# Patient Record
Sex: Female | Born: 1980
Health system: Southern US, Community
[De-identification: ages and names within clinical notes are randomized; demographics above are authoritative.]

## PROBLEM LIST (undated history)

## (undated) DIAGNOSIS — J45909 Unspecified asthma, uncomplicated: Secondary | ICD-10-CM

## (undated) DIAGNOSIS — J40 Bronchitis, not specified as acute or chronic: Secondary | ICD-10-CM

## (undated) HISTORY — DX: Bronchitis, not specified as acute or chronic: J40

## (undated) HISTORY — DX: Unspecified asthma, uncomplicated: J45.909

## (undated) HISTORY — PX: BRONCHOSCOPY: SUR163

---

## 2019-12-13 ENCOUNTER — Encounter (HOSPITAL_COMMUNITY): Payer: Self-pay

## 2019-12-13 ENCOUNTER — Other Ambulatory Visit: Payer: Self-pay

## 2019-12-13 ENCOUNTER — Ambulatory Visit (INDEPENDENT_AMBULATORY_CARE_PROVIDER_SITE_OTHER): Payer: No Typology Code available for payment source

## 2019-12-13 ENCOUNTER — Ambulatory Visit (HOSPITAL_COMMUNITY)
Admission: EM | Admit: 2019-12-13 | Discharge: 2019-12-13 | Disposition: A | Discharge status: Home / Self Care | Payer: No Typology Code available for payment source | Attending: Family Medicine | Admitting: Family Medicine

## 2019-12-13 DIAGNOSIS — Z79899 Other long term (current) drug therapy: Secondary | ICD-10-CM | POA: Insufficient documentation

## 2019-12-13 DIAGNOSIS — Z20822 Contact with and (suspected) exposure to covid-19: Secondary | ICD-10-CM | POA: Insufficient documentation

## 2019-12-13 DIAGNOSIS — R062 Wheezing: Secondary | ICD-10-CM | POA: Diagnosis not present

## 2019-12-13 DIAGNOSIS — R042 Hemoptysis: Secondary | ICD-10-CM | POA: Insufficient documentation

## 2019-12-13 DIAGNOSIS — R0989 Other specified symptoms and signs involving the circulatory and respiratory systems: Secondary | ICD-10-CM | POA: Diagnosis not present

## 2019-12-13 DIAGNOSIS — R059 Cough, unspecified: Secondary | ICD-10-CM

## 2019-12-13 DIAGNOSIS — R0789 Other chest pain: Secondary | ICD-10-CM | POA: Diagnosis not present

## 2019-12-13 DIAGNOSIS — R05 Cough: Secondary | ICD-10-CM

## 2019-12-13 DIAGNOSIS — R079 Chest pain, unspecified: Secondary | ICD-10-CM | POA: Insufficient documentation

## 2019-12-13 DIAGNOSIS — R0602 Shortness of breath: Secondary | ICD-10-CM | POA: Insufficient documentation

## 2019-12-13 MED ORDER — GUAIFENESIN ER 600 MG PO TB12: 600.0000 mg | ORAL_TABLET | Freq: Two times a day (BID) | ORAL | 0 refills | Status: DC

## 2019-12-13 MED ORDER — PREDNISONE 10 MG PO TABS: 40.0000 mg | ORAL_TABLET | Freq: Every day | ORAL | 0 refills | Status: AC

## 2019-12-13 MED ORDER — AZITHROMYCIN 250 MG PO TABS: 250.0000 mg | ORAL_TABLET | Freq: Every day | ORAL | 0 refills | Status: DC

## 2019-12-13 NOTE — ED Provider Notes (Signed)
[facility]    CSN: [id number] Arrival date & time: 12/13/19  1113      History   Chief Complaint Chief Complaint  Patient presents with  . Cough    HPI Erica Khan is a 39 y.o. female.   Otherwise healthy 39 year old female presents today with approximate 1 week or more of productive cough,increased sputum production,  mild shortness of breath.  Symptoms have been constant, waxing and waning. Somewhat improved over the past 2 days. Feels better when she clears the sputum out of her chest.  She has been coughing up blood-tinged sputum and sputum varies in color.  Recent travel back to the [place] from [place].  She has not been taking anything for her symptoms.  Denies any fever, chills, body aches, nasal congestion, rhinorrhea, sore throat or ear pain.  Denies any history of DVT or PE.  ROS per HPI      History reviewed. No pertinent past medical history.  There are no problems to display for this patient.   History reviewed. No pertinent surgical history.  OB History   No obstetric history on file.      Home Medications    Prior to Admission medications   Medication Sig Start Date End Date Taking? Authorizing Provider  azithromycin (ZITHROMAX) 250 MG tablet Take 1 tablet (250 mg total) by mouth daily. Take first 2 tablets together, then 1 every day until finished. 12/13/19   Jatinder Mcdonagh A, NP  guaiFENesin (MUCINEX) 600 MG 12 hr tablet Take 1 tablet (600 mg total) by mouth 2 (two) times daily. 12/13/19   Dahlia Byes A, NP  predniSONE (DELTASONE) 10 MG tablet Take 4 tablets (40 mg total) by mouth daily for 5 days. 12/13/19 12/18/19  Janace Aris, NP    Family History Family History  Problem Relation Age of Onset  . Healthy Mother   . Healthy Father     Social History Social History   Tobacco Use  . Smoking status: Never Smoker  . Smokeless tobacco: Never Used  Substance Use Topics  . Alcohol use: Yes    Comment: occ  . Drug use: Not on file      Allergies   Patient has no known allergies.   Review of Systems Review of Systems   Physical Exam Triage Vital Signs ED Triage Vitals  Enc Vitals Group     BP 12/13/19 1232 101/72     Pulse Rate 12/13/19 1232 77     Resp 12/13/19 1232 19     Temp 12/13/19 1232 98.8 F (37.1 C)     Temp Source 12/13/19 1232 Oral     SpO2 12/13/19 1232 99 %     Weight --      Height --      Head Circumference --      Peak Flow --      Pain Score 12/13/19 1230 0     Pain Loc --      Pain Edu? --      Excl. in GC? --    No data found.  Updated Vital Signs BP 101/72 (BP Location: Right Arm)   Pulse 77   Temp 98.8 F (37.1 C) (Oral)   Resp 19   LMP 11/20/2019 (Exact Date)   SpO2 99%   Visual Acuity Right Eye Distance:   Left Eye Distance:   Bilateral Distance:    Right Eye Near:   Left Eye Near:    Bilateral Near:  Physical Exam Vitals and nursing note reviewed.  Constitutional:      General: She is not in acute distress.    Appearance: Normal appearance. She is not ill-appearing, toxic-appearing or diaphoretic.  HENT:     Head: Normocephalic.     Nose: Nose normal.     Mouth/Throat:     Pharynx: Oropharynx is clear.  Eyes:     Conjunctiva/sclera: Conjunctivae normal.  Cardiovascular:     Rate and Rhythm: Normal rate and regular rhythm.     Pulses: Normal pulses.     Heart sounds: Normal heart sounds.  Pulmonary:     Effort: Pulmonary effort is normal.     Breath sounds: Wheezing and rhonchi present.  Musculoskeletal:        General: Normal range of motion.     Cervical back: Normal range of motion.  Skin:    General: Skin is warm and dry.     Findings: No rash.  Neurological:     Mental Status: She is alert.  Psychiatric:        Mood and Affect: Mood normal.      UC Treatments / Results  Labs (all labs ordered are listed, but only abnormal results are displayed) Labs Reviewed  NOVEL CORONAVIRUS, NAA (HOSP ORDER, SEND-OUT TO REF LAB; TAT 18-24  HRS)    EKG   Radiology No results found.  Procedures Procedures (including critical care time)  Medications Ordered in UC Medications - No data to display  Initial Impression / Assessment and Plan / UC Course  I have reviewed the triage vital signs and the nursing notes.  Pertinent labs & imaging results that were available during my care of the patient were reviewed by me and considered in my medical decision making (see chart for details).     Cough- pt with one week of cough,increased sputum and mild SOB.  X ray revealed Significant right-sided bullous changes with associated pleural effusion. This likely is mostly chronic in nature although given the effusion and acute infiltrative component cannot be totally excluded. CT of the chest with contrast may be helpful for further Evaluation. Spoke with pt about results  Consulted with infectious disease and didn't believe TB or anything infectious to be the cause.  Most likely chronic problem Recommended follow up with primary care for CT scan and follow up with pulmonology.  Pt understanding ans agreed Will treat for acute infectious process to see if this improves symptoms.    Final Clinical Impressions(s) / UC Diagnoses   Final diagnoses:  None     Discharge Instructions     Take the medication as prescribed Please follow up for CT scan I am giving you contact for primary care doctor.  Call them today.  If your breathing gets worse go to the ER    ED Prescriptions    Medication Sig Dispense Auth. Provider   predniSONE (DELTASONE) 10 MG tablet Take 4 tablets (40 mg total) by mouth daily for 5 days. 20 tablet Felcia Huebert A, NP   azithromycin (ZITHROMAX) 250 MG tablet Take 1 tablet (250 mg total) by mouth daily. Take first 2 tablets together, then 1 every day until finished. 6 tablet Adalina Dopson A, NP   guaiFENesin (MUCINEX) 600 MG 12 hr tablet Take 1 tablet (600 mg total) by mouth 2 (two) times daily. 15  tablet Keion Neels A, NP     PDMP not reviewed this encounter.   Orvan July, NP 12/16/19 1555

## 2019-12-13 NOTE — Discharge Instructions (Signed)
Take the medication as prescribed Please follow up for CT scan I am giving you contact for primary care doctor.  Call them today.  If your breathing gets worse go to the ER

## 2019-12-13 NOTE — ED Triage Notes (Signed)
Patient presents to Urgent Care with complaints of productive cough since about a week ago when she came back to the [place] from [place]. Patient reports she becomes short of breath at night and with exertion.

## 2019-12-13 NOTE — ED Notes (Signed)
No answer when called for room from lobby x2. Will retry.

## 2019-12-15 LAB — NOVEL CORONAVIRUS, NAA (HOSP ORDER, SEND-OUT TO REF LAB; TAT 18-24 HRS): SARS-CoV-2, NAA: NOT DETECTED

## 2019-12-16 ENCOUNTER — Telehealth (HOSPITAL_COMMUNITY): Payer: Self-pay

## 2020-02-11 ENCOUNTER — Other Ambulatory Visit: Payer: Self-pay

## 2020-02-11 ENCOUNTER — Ambulatory Visit (INDEPENDENT_AMBULATORY_CARE_PROVIDER_SITE_OTHER): Payer: No Typology Code available for payment source | Admitting: Physician Assistant

## 2020-02-11 ENCOUNTER — Encounter: Payer: Self-pay | Admitting: Physician Assistant

## 2020-02-11 VITALS — BP 100/64 | HR 65 | Temp 97.3°F | Ht 61.5 in | Wt 120.0 lb

## 2020-02-11 DIAGNOSIS — R102 Pelvic and perineal pain: Secondary | ICD-10-CM | POA: Diagnosis not present

## 2020-02-11 DIAGNOSIS — J984 Other disorders of lung: Secondary | ICD-10-CM | POA: Diagnosis not present

## 2020-02-11 LAB — POCT URINE PREGNANCY: Preg Test, Ur: NEGATIVE

## 2020-02-11 NOTE — Progress Notes (Signed)
Erica Khan is a 39 y.o. female here to Establish care.  I acted as a Neurosurgeon for [employer], PA-C Corky Mull, LPN  History of Present Illness:   Chief Complaint  Patient presents with  . Establish Care  . Asthma  . Pelvic Pain    Asthma Pt was seen at Urgent care last month and treated. Coughing and expectorating light yellow mucus at night. Hx of bronchitis. Was given Symbicort and Albuterol prn. She uses Symbicort regularly but the Albuterol is rare. Prior to coming to the [place], she was being seen regularly in [place]. 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 She was considering having this done but then she left to come to [place] for grad school.  Pelvic pain Pt c/o low pelvic pain x one week. White vaginal discharge, denies itching. Had one episode of sharp abdominal pain that resolved. Patient's last menstrual period was 01/15/2020 (approximate). Due for her period. She is sexually active. Negative urine pregnancy test last week. Denies burning with urination, n/v, constipation. Feels like she has to push fluids.  Health Maintenance: Immunizations -- declines Flu, needs Tdap but will defer so she can get her COVID vaxx soon Colonoscopy -- N/A Mammogram -- N/A PAP -- never Bone Density --N/A Weight -- Weight: 120 lb (54.4 kg)   Depression screen [facility] 2/9 02/11/2020  Decreased Interest 0  Down, Depressed, Hopeless 0  PHQ - 2 Score 0    No flowsheet data found.   Other providers/specialists: Patient Care Team: Jarold Motto, [place] as PCP - General (Physician Assistant)   Past Medical History:  Diagnosis Date  . Asthma   . Bronchitis      Social History   Socioeconomic History  . Marital status: Single    Spouse name: Not on file  . Number of children: Not on file  . Years of education: Not on file  . Highest education level: Not on file  Occupational History   . Not on file  Tobacco Use  . Smoking status: Never Smoker  . Smokeless tobacco: Never Used  Substance and Sexual Activity  . Alcohol use: Yes    Comment: occ  . Drug use: Never  . Sexual activity: Yes    Birth control/protection: Condom  Other Topics Concern  . Not on file  Social History Narrative   From [place]   Currently at [employer] to [place] at end of 2020   Social Determinants of Corporate investment banker Strain:   . Difficulty of Paying Living Expenses:   Food Insecurity:   . Worried About Programme researcher, broadcasting/film/video in the Last Year:   . Barista in the Last Year:   Transportation Needs:   . Freight forwarder (Medical):   Marland Kitchen Lack of Transportation (Non-Medical):   Physical Activity:   . Days of Exercise per Week:   . Minutes of Exercise per Session:   Stress:   . Feeling of Stress :   Social Connections:   . Frequency of Communication with Friends and Family:   . Frequency of Social Gatherings with Friends and Family:   . Attends Religious Services:   . Active Member of Clubs or Organizations:   . Attends Banker Meetings:   Marland Kitchen Marital Status:   Intimate Partner Violence:   . Fear of Current or Ex-Partner:   . Emotionally  Abused:   Marland Kitchen Physically Abused:   . Sexually Abused:     Past Surgical History:  Procedure Laterality Date  . CESAREAN SECTION  2011    Family History  Problem Relation Age of Onset  . Osteoarthritis Mother   . Cancer Neg Hx     No Known Allergies   Current Medications:   Current Outpatient Medications:  .  albuterol (VENTOLIN HFA) 108 (90 Base) MCG/ACT inhaler, SMARTSIG:1-2 Puff(s) Via Inhaler Every 4-6 Hours PRN, Disp: , Rfl:  .  SYMBICORT 160-4.5 MCG/ACT inhaler, , Disp: , Rfl:    Review of Systems:   ROS Negative unless otherwise specified per HPI. Vitals:   Vitals:   02/11/20 1403  BP: 100/64  Pulse: 65  Temp: (!) 97.3 F (36.3 C)  TempSrc: Temporal  SpO2: 99%  Weight: 120 lb (54.4 kg)   Height: 5' 1.5" (1.562 m)      Body mass index is 22.31 kg/m.  Physical Exam:   Physical Exam Vitals and nursing note reviewed.  Constitutional:      General: She is not in acute distress.    Appearance: She is well-developed. She is not ill-appearing or toxic-appearing.  HENT:     Head: Normocephalic and atraumatic.  Cardiovascular:     Rate and Rhythm: Normal rate and regular rhythm.     Heart sounds: Normal heart sounds.  Pulmonary:     Effort: Pulmonary effort is normal. No accessory muscle usage or respiratory distress.     Breath sounds: Examination of the right-upper field reveals decreased breath sounds and rhonchi. Examination of the right-middle field reveals decreased breath sounds and rhonchi. Examination of the right-lower field reveals decreased breath sounds and rhonchi. Decreased breath sounds and rhonchi present.  Abdominal:     General: Abdomen is flat. Bowel sounds are normal.     Palpations: Abdomen is soft.     Tenderness: There is no abdominal tenderness.  Skin:    General: Skin is warm and dry.  Neurological:     Mental Status: She is alert.  Psychiatric:        Speech: Speech normal.        Behavior: Behavior is cooperative.     Results for orders placed or performed in visit on 02/11/20  POCT urine pregnancy  Result Value Ref Range   Preg Test, Ur Negative Negative    Assessment and Plan:   Jasmon was seen today for establish care, asthma and pelvic pain.  Diagnoses and all orders for this visit:  Pelvic pain Symptoms essentially resolved on their own. Urine pregnancy test was negative. Continue to monitor and follow-up if symptoms return. She declined further work-up. -     POCT urine pregnancy  Lung disease Referral to pulmonary after July 1st, per patient's request (due to insurance reasons.) -     Ambulatory referral to Pulmonology  Reviewed expectations re: course of current medical issues. Discussed self-management of  symptoms. Outlined signs and symptoms indicating need for more acute intervention. Patient verbalized understanding and all questions were answered. See orders for this visit as documented in the electronic medical record. Patient received an After-Visit Summary.  CMA or LPN served as scribe during this visit. History, Physical, and Plan performed by medical provider. The above documentation has been reviewed and is accurate and complete.   Inda Coke, PA-C

## 2020-02-11 NOTE — Patient Instructions (Signed)
It was great to see you!  A referral has been placed for you to see  Pulmonary.  You have a referral in for [facility].  Please call them at [phone] to set up your appointment if you have not been contacted by their office within 3-5 business days.   Take care,  Jarold Motto PA-C

## 2020-03-05 ENCOUNTER — Other Ambulatory Visit: Payer: Self-pay

## 2020-03-05 ENCOUNTER — Encounter: Payer: Self-pay | Admitting: Physician Assistant

## 2020-03-05 ENCOUNTER — Telehealth (INDEPENDENT_AMBULATORY_CARE_PROVIDER_SITE_OTHER): Payer: No Typology Code available for payment source | Admitting: Physician Assistant

## 2020-03-05 ENCOUNTER — Other Ambulatory Visit: Payer: Self-pay | Admitting: Physician Assistant

## 2020-03-05 VITALS — Ht 61.5 in | Wt 117.0 lb

## 2020-03-05 DIAGNOSIS — J302 Other seasonal allergic rhinitis: Secondary | ICD-10-CM | POA: Diagnosis not present

## 2020-03-05 DIAGNOSIS — J984 Other disorders of lung: Secondary | ICD-10-CM | POA: Diagnosis not present

## 2020-03-05 MED ORDER — MONTELUKAST SODIUM 10 MG PO TABS: 10.0000 mg | ORAL_TABLET | Freq: Every day | ORAL | 1 refills | Status: DC

## 2020-03-05 NOTE — Progress Notes (Signed)
Virtual Visit via Video   I connected with Erica Khan on 03/05/20 at  7:30 AM EDT by a video enabled telemedicine application and verified that I am speaking with the correct person using two identifiers. Location patient: Home Location provider: [facility], Office Persons participating in the virtual visit: Erica Khan, [place] PA-C, Anselmo Pickler, LPN   I discussed the limitations of evaluation and management by telemedicine and the availability of in person appointments. The patient expressed understanding and agreed to proceed.  I acted as a Education administrator for [employer], [employer], LPN  Subjective:   HPI:   Headache Pt received J&J Covid vaccine last Thurs. Symptoms started 30 hours after vaccine. Pt c/o cough non-productive, headache, nausea, fatigue, chills, sore throat and some chest tightness. Pt took tylenol x 1 with relief.  She is currently taking symbicort twice daily and has also started spiriva.  She states that this is her first spring in the [place]. She has history of seasonal allergies.  ROS: See pertinent positives and negatives per HPI.  Patient's last menstrual period was 02/12/2020.   There are no problems to display for this patient.   Social History   Tobacco Use  . Smoking status: Never Smoker  . Smokeless tobacco: Never Used  Substance Use Topics  . Alcohol use: Yes    Comment: occ    Current Outpatient Medications:  .  albuterol (VENTOLIN HFA) 108 (90 Base) MCG/ACT inhaler, SMARTSIG:1-2 Puff(s) Via Inhaler Every 4-6 Hours PRN, Disp: , Rfl:  .  SYMBICORT 160-4.5 MCG/ACT inhaler, Inhale 2 puffs into the lungs every 6 (six) hours as needed. , Disp: , Rfl:  .  Tiotropium Bromide Monohydrate (SPIRIVA RESPIMAT) 2.5 MCG/ACT AERS, Inhale 1 puff into the lungs daily., Disp: , Rfl:  .  montelukast (SINGULAIR) 10 MG tablet, Take 1 tablet (10 mg total) by mouth at bedtime., Disp: 30 tablet, Rfl: 1  No Known Allergies  Objective:    VITALS: Per patient if applicable, see vitals. GENERAL: Alert, appears well and in no acute distress. HEENT: Atraumatic, conjunctiva clear, no obvious abnormalities on inspection of external nose and ears. NECK: Normal movements of the head and neck. CARDIOPULMONARY: No increased WOB. Speaking in clear sentences. I:E ratio WNL.  MS: Moves all visible extremities without noticeable abnormality. PSYCH: Pleasant and cooperative, well-groomed. Speech normal rate and rhythm. Affect is appropriate. Insight and judgement are appropriate. Attention is focused, linear, and appropriate.  NEURO: CN grossly intact. Oriented as arrived to appointment on time with no prompting. Moves both UE equally.  SKIN: No obvious lesions, wounds, erythema, or cyanosis noted on face or hands.  Assessment and Plan:   [name] was seen today for headache, nausea and fatigue.  Diagnoses and all orders for this visit:  Lung disease  Seasonal allergies  Other orders -     montelukast (SINGULAIR) 10 MG tablet; Take 1 tablet (10 mg total) by mouth at bedtime.   No red flags on discussion. Suspect that patient had normal immune response to vaccine but is also having allergies. No indication to start abx at this time. Will restart her singulair. Continue inhalers. I offered oral prednisone but she declined. Low threshold to trial oral prednisone if she develops wheezing. Worsening precautions advised.  . Reviewed expectations re: course of current medical issues. . Discussed self-management of symptoms. . Outlined signs and symptoms indicating need for more acute intervention. . Patient verbalized understanding and all questions were answered. Marland Kitchen Health Maintenance issues including  appropriate healthy diet, exercise, and smoking avoidance were discussed with patient. . See orders for this visit as documented in the electronic medical record.  I discussed the assessment and treatment plan with the patient. The patient  was provided an opportunity to ask questions and all were answered. The patient agreed with the plan and demonstrated an understanding of the instructions.   The patient was advised to call back or seek an in-person evaluation if the symptoms worsen or if the condition fails to improve as anticipated.   CMA or LPN served as scribe during this visit. History, Physical, and Plan performed by medical provider. The above documentation has been reviewed and is accurate and complete.   [place], [place] 03/05/2020

## 2020-04-08 ENCOUNTER — Encounter: Payer: Self-pay | Admitting: Physician Assistant

## 2020-04-08 ENCOUNTER — Telehealth (INDEPENDENT_AMBULATORY_CARE_PROVIDER_SITE_OTHER): Payer: No Typology Code available for payment source | Admitting: Physician Assistant

## 2020-04-08 ENCOUNTER — Telehealth: Payer: Self-pay

## 2020-04-08 VITALS — Ht 61.5 in | Wt 119.0 lb

## 2020-04-08 DIAGNOSIS — R0981 Nasal congestion: Secondary | ICD-10-CM | POA: Diagnosis not present

## 2020-04-08 MED ORDER — AZITHROMYCIN 250 MG PO TABS: ORAL_TABLET | ORAL | 0 refills | Status: DC

## 2020-04-08 NOTE — Telephone Encounter (Signed)
Triage note 

## 2020-04-08 NOTE — Progress Notes (Signed)
Virtual Visit via Video   I connected with Erica Khan on 04/08/20 at 11:30 AM EDT by a video enabled telemedicine application and verified that I am speaking with the correct person using two identifiers. Location patient: Home Location provider: [facility], Office Persons participating in the virtual visit: Erica Khan, Bennie PA-C, Erica Pickler, LPN   I discussed the limitations of evaluation and management by telemedicine and the availability of in person appointments. The patient expressed understanding and agreed to proceed.  I acted as a Education administrator for [employer], PA-C [employer], LPN   Subjective:   HPI:   Symptom onset: started a week ago  Travel/contacts: No travel or exposure  Patient endorses the following symptoms: Fever (not sure just feels like mild temp), sinus headache, sinus congestion, rhinorrhea, sore throat, difficulty swallowing, productive cough (light yellow), wheezing, chest tightness and myalgia  Patient denies the following symptoms: ear pain, ear drainage and chest pain  Treatments tried: Symbicort and Spiriva, singulair  She has been fully vaccinated for COVID-19.   Patient risk factors: Current [id number] risk of complications score: 0 Smoking status: Erica Khan  reports that she has never smoked. She has never used smokeless tobacco. If female, currently pregnant? []   Yes [x]   No  ROS: See pertinent positives and negatives per HPI.  There are no problems to display for this patient.   Social History   Tobacco Use  . Smoking status: Never Smoker  . Smokeless tobacco: Never Used  Substance Use Topics  . Alcohol use: Yes    Comment: occ    Current Outpatient Medications:  .  albuterol (VENTOLIN HFA) 108 (90 Base) MCG/ACT inhaler, SMARTSIG:1-2 Puff(s) Via Inhaler Every 4-6 Hours PRN, Disp: , Rfl:  .  montelukast (SINGULAIR) 10 MG tablet, Take 1 tablet (10 mg total) by mouth at bedtime., Disp: 30 tablet, Rfl:  1 .  SYMBICORT 160-4.5 MCG/ACT inhaler, Inhale 2 puffs into the lungs every 6 (six) hours as needed. , Disp: , Rfl:  .  Tiotropium Bromide Monohydrate (SPIRIVA RESPIMAT) 2.5 MCG/ACT AERS, Inhale 1 puff into the lungs daily., Disp: , Rfl:  .  azithromycin (ZITHROMAX) 250 MG tablet, Take two tablets on day 1, then one daily x 4 days, Disp: 6 tablet, Rfl: 0  No Known Allergies  Objective:   VITALS: Per patient if applicable, see vitals. GENERAL: Alert, appears well and in no acute distress. HEENT: Atraumatic, conjunctiva clear, no obvious abnormalities on inspection of external nose and ears. NECK: Normal movements of the head and neck. CARDIOPULMONARY: No increased WOB. Speaking in clear sentences. I:E ratio WNL.  MS: Moves all visible extremities without noticeable abnormality. PSYCH: Pleasant and cooperative, well-groomed. Speech normal rate and rhythm. Affect is appropriate. Insight and judgement are appropriate. Attention is focused, linear, and appropriate.  NEURO: CN grossly intact. Oriented as arrived to appointment on time with no prompting. Moves both UE equally.  SKIN: No obvious lesions, wounds, erythema, or cyanosis noted on face or hands.  Assessment and Plan:   Erica Khan was seen today for sore throat, headache and fever.  Diagnoses and all orders for this visit:  Sinus congestion  Other orders -     azithromycin (ZITHROMAX) 250 MG tablet; Take two tablets on day 1, then one daily x 4 days    Patient has a respiratory illness without signs of acute distress or respiratory compromise at this time.   Given history of lung disease and bronchitis, will treat with oral azithromycin. I  offered oral prednisone however she declined.   She declined COVID-19 testing at this time.  Worsening precautions advised.  . Reviewed expectations re: course of current medical issues. . Discussed self-management of symptoms. . Outlined signs and symptoms indicating need for more acute  intervention. . Patient verbalized understanding and all questions were answered. Marland Kitchen Health Maintenance issues including appropriate healthy diet, exercise, and smoking avoidance were discussed with patient. . See orders for this visit as documented in the electronic medical record.  I discussed the assessment and treatment plan with the patient. The patient was provided an opportunity to ask questions and all were answered. The patient agreed with the plan and demonstrated an understanding of the instructions.   The patient was advised to call back or seek an in-person evaluation if the symptoms worsen or if the condition fails to improve as anticipated.   CMA or LPN served as scribe during this visit. History, Physical, and Plan performed by medical provider. The above documentation has been reviewed and is accurate and complete.   [place], [place] 04/08/2020

## 2020-04-08 NOTE — Telephone Encounter (Signed)
[facility] RECORD [facility] Patient Name: Erica Khan  Gender: Female DOB: 04-Dec-1980 Age: 39 Y 1 M 15 D Return Phone Number: [phone] (Primary), [phone] (Secondary) [place] [address] Client [facility] Client Site [place] Day Physician Inda Coke- PA Contact Type Call Who Is Calling Patient / Member / Family / Caregiver Call Type Triage / Clinical Relationship To Patient Self Return Phone Number [phone] (Primary) Chief Complaint Sore Throat Reason for Call Symptomatic / Request for Health Information Initial Comment Caller states, pt has cough, sore throat, and possible temp. Translation No Nurse Assessment Nurse: Ysidro Evert, RN, Levada Dy Date/Time (Eastern Time): 04/08/2020 9:36:20 AM Confirm and document reason for call. If symptomatic, describe symptoms. ---Caller states she has a sore throat and cough that started about a week ago. She has a headache also. She feels feverish but doesn't have a thermometer Has the patient had close contact with a person known or suspected to have the novel coronavirus illness OR traveled / lives in area with major community spread (including international travel) in the last 14 days from the onset of symptoms? * If Asymptomatic, screen for exposure and travel within the last 14 days. ---No Does the patient have any new or worsening symptoms? ---Yes Will a triage be completed? ---Yes Related visit to physician within the last 2 weeks? ---No Does the PT have any chronic conditions? (i.e. diabetes, asthma, this includes High risk factors for pregnancy, etc.) ---Yes List chronic conditions. ---asthma Is the patient pregnant or possibly pregnant? (Ask all females between the ages of 78-55) ---No Is this a behavioral health or substance abuse call?  ---No Guidelines Guideline Title Affirmed Question Affirmed Notes Nurse Date/Time (Eastern Time) Sore Throat [1] Pus on tonsils (back of throat) AND [2] fever AND [3] swollen neck lymph nodes ("glands") Earleen Reaper 04/08/2020 9:37:39 AMPLEASE NOTE: All timestamps contained within this report are represented as [place] Standard Time. CONFIDENTIALTY NOTICE: This fax transmission is intended only for the addressee. It contains information that is legally privileged, confidential or otherwise protected from use or disclosure. If you are not the intended recipient, you are strictly prohibited from reviewing, disclosing, copying using or disseminating any of this information or taking any action in reliance on or regarding this information. If you have received this fax in error, please notify us immediately by telephone so that we can arrange for its return to [place]. Phone: [phone], Toll-Free: [phone], Fax: [phone] Page: 2 of 2 Call Id: [id number] [place]. Time Eilene Ghazi Time) Disposition Final User 04/08/2020 9:43:48 AM See PCP within 24 Hours Yes Ysidro Evert, RN, Marin Shutter Disagree/Comply Comply Caller Understands Yes PreDisposition Did not know what to do Care Advice Given Per Guideline SEE PCP WITHIN 24 HOURS: * IF OFFICE WILL BE OPEN: You need to be seen within the next 24 hours. Call your doctor (or NP/PA) when the office opens and make an appointment. SORE THROAT: * Here are some simple things you can do to treat and reduce sore throat pain. * Sip warm chicken broth or apple juice. * Suck on hard candy or an over-the-counter throat lozenge. * Gargle with warm salt water four times a day. To make salt water, put 1/2 teaspoon of salt in 8 oz (240 ml) of warm water. * IBUPROFEN (E.G., MOTRIN, ADVIL): Take 400 mg (two 200 mg pills) by mouth every 6 hours. The most you should  take each day is 1,200 mg (six 200 mg pills), unless your doctor has told you to take more. SOFT DIET: * Eat  a soft diet. * Cold drinks, popsicles, and milk shakes are especially good. Avoid citrus fruits. CALL BACK IF: * You become worse. CARE ADVICE given per Sore Throat (Adult) guideline. Comments User: Leward Quan, RN Date/Time Lamount Cohen Time): 04/08/2020 9:43:45 AM Warm transferred the patient to the office to make an appt Referrals REFERRED TO PCP OFFICE

## 2020-05-23 ENCOUNTER — Telehealth: Payer: Self-pay

## 2020-05-23 ENCOUNTER — Ambulatory Visit: Payer: No Typology Code available for payment source | Admitting: Family Medicine

## 2020-05-23 ENCOUNTER — Telehealth: Payer: Self-pay | Admitting: Physician Assistant

## 2020-05-23 ENCOUNTER — Other Ambulatory Visit: Payer: Self-pay

## 2020-05-23 DIAGNOSIS — J984 Other disorders of lung: Secondary | ICD-10-CM

## 2020-05-23 NOTE — Telephone Encounter (Signed)
Pt called asking if Lelon Mast could put in a new referral for pulmonary. Please advise.

## 2020-05-29 NOTE — Telephone Encounter (Signed)
error 

## 2020-06-02 ENCOUNTER — Ambulatory Visit (INDEPENDENT_AMBULATORY_CARE_PROVIDER_SITE_OTHER): Payer: No Typology Code available for payment source | Admitting: Physician Assistant

## 2020-06-02 ENCOUNTER — Encounter: Payer: Self-pay | Admitting: Physician Assistant

## 2020-06-02 ENCOUNTER — Other Ambulatory Visit: Payer: Self-pay

## 2020-06-02 VITALS — BP 112/76 | HR 75 | Temp 98.1°F | Ht 61.5 in | Wt 122.4 lb

## 2020-06-02 DIAGNOSIS — J984 Other disorders of lung: Secondary | ICD-10-CM

## 2020-06-02 NOTE — Patient Instructions (Addendum)
It was great to see you!  I will resubmit your referral and have them give you a call.  Please call them at [phone] after Thursday if you'd like to push them for an appointment.  If you do not get a sooner appointment, please let me know and we can try a different location.  Take care,  Jarold Motto PA-C

## 2020-06-02 NOTE — Progress Notes (Signed)
Patient is here to inquire about referral for Pulmonology.  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 This likely is mostly chronic in nature although given the effusion and acute infiltrative component cannot be totally excluded. CT of the chest with contrast may be helpful for further Evaluation.  From my note on 02/11/20: Prior to coming to the [place], she was being seen regularly in [place]. 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 She was considering having this done but then she left to come to [place] for grad school.  She is requesting urgent referral for pulmonology.  I will place referral for possible sooner appointment but did discuss that I cannot guarantee that she will be able to be seen sooner. If needed, we may need to refer outside of [facility].  No charge for today's visit.  Jarold Motto PA-C

## 2020-07-11 ENCOUNTER — Other Ambulatory Visit: Payer: Self-pay

## 2020-07-11 ENCOUNTER — Ambulatory Visit (INDEPENDENT_AMBULATORY_CARE_PROVIDER_SITE_OTHER): Payer: No Typology Code available for payment source | Admitting: Pulmonary Disease

## 2020-07-11 ENCOUNTER — Encounter: Payer: Self-pay | Admitting: Pulmonary Disease

## 2020-07-11 VITALS — BP 108/68 | HR 78 | Temp 97.9°F | Ht 63.0 in | Wt 124.8 lb

## 2020-07-11 DIAGNOSIS — J479 Bronchiectasis, uncomplicated: Secondary | ICD-10-CM

## 2020-07-11 MED ORDER — BUDESONIDE-FORMOTEROL FUMARATE 160-4.5 MCG/ACT IN AERO: 2.0000 | INHALATION_SPRAY | Freq: Two times a day (BID) | RESPIRATORY_TRACT | 11 refills | Status: DC

## 2020-07-11 MED ORDER — ALBUTEROL SULFATE HFA 108 (90 BASE) MCG/ACT IN AERS: 2.0000 | INHALATION_SPRAY | Freq: Four times a day (QID) | RESPIRATORY_TRACT | 5 refills | Status: DC | PRN

## 2020-07-11 MED ORDER — SODIUM CHLORIDE 0.9 % IN NEBU: 3.0000 mL | INHALATION_SOLUTION | Freq: Two times a day (BID) | RESPIRATORY_TRACT | 11 refills | Status: DC | PRN

## 2020-07-11 NOTE — Progress Notes (Signed)
Erica Khan    [id number]    22-Oct-1981  Primary Care Physician:Worley, Lelon Mast, PA  Referring Physician: Jarold Motto, PA [address] [place],  [place] [address]  Chief complaint:   Patient being seen for shortness of breath  HPI:  History of chronic shortness of breath Did have a severe respiratory infection at about age 39 left eye with bronchiectasis in the right lung  Has a constant cough with mucus production Is not feeling acutely ill at present  Has shortness of breath with wheezing Occasional throat discomfort  She currently is on Symbicort and started using Spiriva  Was evaluated about 2018 in [place] with a chest x-ray and CT scan which I reviewed with the patient showing 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 albuterol (VENTOLIN HFA) 108 (90 Base) MCG/ACT inhaler SMARTSIG:1-2 Puff(s) Via Inhaler Every 4-6 Hours PRN  . montelukast (SINGULAIR) 10 MG tablet Take 1 tablet (10 mg total) by mouth at bedtime.  . SYMBICORT 160-4.5 MCG/ACT inhaler Inhale 2 puffs into the lungs every 6 (six) hours as needed.   . Tiotropium Bromide Monohydrate (SPIRIVA RESPIMAT) 2.5 MCG/ACT AERS Inhale 1 puff into the lungs daily.  . [DISCONTINUED] azithromycin (ZITHROMAX) 250 MG tablet Take two tablets on day 1, then one daily x 4 days  . albuterol (VENTOLIN HFA) 108 (90 Base) MCG/ACT inhaler Inhale 2 puffs into the lungs every 6 (six) hours as needed.  . budesonide-formoterol (SYMBICORT) 160-4.5 MCG/ACT inhaler Inhale 2 puffs into the lungs 2 (two) times daily.  . sodium chloride 0.9 % nebulizer solution Take 3 mLs by nebulization 2 (two) times daily as needed for wheezing.   No facility-administered encounter medications on file as of 07/11/2020.    Allergies as of 07/11/2020  . (No Known Allergies)    Past Medical History:  Diagnosis Date  . Asthma   .  Bronchitis     Past Surgical History:  Procedure Laterality Date  . CESAREAN SECTION  2011    Family History  Problem Relation Age of Onset  . Osteoarthritis Mother   . Cancer Neg Hx     Social History   Socioeconomic History  . Marital status: Single    Spouse name: Not on file  . Number of children: Not on file  . Years of education: Not on file  . Highest education level: Not on file  Occupational History  . Not on file  Tobacco Use  . Smoking status: Never Smoker  . Smokeless tobacco: Never Used  Vaping Use  . Vaping Use: Never used  Substance and Sexual Activity  . Alcohol use: Yes    Comment: occ  . Drug use: Never  . Sexual activity: Yes    Birth control/protection: Condom  Other Topics Concern  . Not on file  Social History Narrative   From [place]   Currently at [employer] to [place] at end of 2020   Social Determinants of Health   Financial Resource Strain:   . Difficulty of Paying Living Expenses: Not on file  Food Insecurity:   . Worried About Programme researcher, broadcasting/film/video in the Last Year: Not on file  . Ran Out of Food in the Last Year: Not on file  Transportation Needs:   . Lack of Transportation (Medical): Not on file  . Lack of Transportation (Non-Medical): Not on file  Physical Activity:   .  Days of Exercise per Week: Not on file  . Minutes of Exercise per Session: Not on file  Stress:   . Feeling of Stress : Not on file  Social Connections:   . Frequency of Communication with Friends and Family: Not on file  . Frequency of Social Gatherings with Friends and Family: Not on file  . Attends Religious Services: Not on file  . Active Member of Clubs or Organizations: Not on file  . Attends Banker Meetings: Not on file  . Marital Status: Not on file  Intimate Partner Violence:   . Fear of Current or Ex-Partner: Not on file  . Emotionally Abused: Not on file  . Physically Abused: Not on file  . Sexually Abused: Not on file    Review of  Systems  Constitutional: Negative for fever.  Respiratory: Positive for cough and shortness of breath.   Cardiovascular: Negative.   Gastrointestinal: Negative.   Musculoskeletal: Negative.  Negative for arthralgias.    Vitals:   07/11/20 1057  BP: 108/68  Pulse: 78  Temp: 97.9 F (36.6 C)  SpO2: 100%     Physical Exam Constitutional:      Appearance: Normal appearance.  HENT:     Head: Normocephalic.     Nose: No congestion.     Mouth/Throat:     Pharynx: No oropharyngeal exudate or posterior oropharyngeal erythema.  Eyes:     Pupils: Pupils are equal, round, and reactive to light.  Cardiovascular:     Rate and Rhythm: Normal rate and regular rhythm.     Pulses: Normal pulses.     Heart sounds: Normal heart sounds. No murmur heard.  No friction rub.  Pulmonary:     Effort: Pulmonary effort is normal. No respiratory distress.     Breath sounds: Normal breath sounds. No stridor. No wheezing or rhonchi.  Musculoskeletal:        General: Normal range of motion.     Cervical back: No rigidity or tenderness.  Skin:    General: Skin is warm.  Neurological:     General: No focal deficit present.     Mental Status: She is alert.    Data Reviewed:  Patient brought CT scans from 2018 which was reviewed with the patient Chest x-ray is reviewed with the patient  Assessment:  Bronchiectasis -Results from extensive pneumonia when she was much younger -Chronic process  Asthma -Continue use of Symbicort twice a day -Rescue inhaler use as needed  Shortness of breath -Continue bronchodilators  Significant mucus production -Continue use of nebulizer -Prescription for hypertonic saline -Use of Mucinex as needed  Discontinue use of Spiriva  Plan/Recommendations: CT scan of the chest without contrast Continue bronchodilators Nebulization with hypertonic saline  Will follow with patient in 4 to 6 weeks  Virl Diamond MD [facility] 07/11/2020, 4:54 PM  CC: Jarold Motto, PA  hyper

## 2020-07-11 NOTE — Patient Instructions (Signed)
Bronchiectasis  -Repeat CT scan of the chest without contrast-this is to evaluate the structure of the right lung VATS chronically collapsed  Symbicort 160, 2 puffs twice a day Albuterol to be used as needed Stop using Spiriva  Hypertonic saline-to be used via the nebulizer twice a day-this will help keep secretions thin and help clear secretions  You may use Mucinex for symptom management, stop it once you feel he can clear secretions easily  I will see you in 4 weeks to review CT with you and any other plans  Call if you feel you are coughing up more secretions or you feel that may be an infection

## 2020-08-04 ENCOUNTER — Ambulatory Visit (INDEPENDENT_AMBULATORY_CARE_PROVIDER_SITE_OTHER)
Admission: RE | Admit: 2020-08-04 | Discharge: 2020-08-04 | Disposition: A | Discharge status: Home / Self Care | Payer: No Typology Code available for payment source | Source: Ambulatory Visit | Attending: Pulmonary Disease | Admitting: Pulmonary Disease

## 2020-08-04 ENCOUNTER — Other Ambulatory Visit: Payer: Self-pay

## 2020-08-04 DIAGNOSIS — J479 Bronchiectasis, uncomplicated: Secondary | ICD-10-CM | POA: Diagnosis not present

## 2020-08-13 ENCOUNTER — Encounter: Payer: Self-pay | Admitting: Pulmonary Disease

## 2020-08-13 ENCOUNTER — Ambulatory Visit (INDEPENDENT_AMBULATORY_CARE_PROVIDER_SITE_OTHER): Payer: No Typology Code available for payment source | Admitting: Pulmonary Disease

## 2020-08-13 ENCOUNTER — Telehealth: Payer: Self-pay | Admitting: Pulmonary Disease

## 2020-08-13 ENCOUNTER — Other Ambulatory Visit: Payer: Self-pay

## 2020-08-13 VITALS — BP 112/68 | Temp 97.2°F | Ht 63.0 in | Wt 119.4 lb

## 2020-08-13 DIAGNOSIS — J479 Bronchiectasis, uncomplicated: Secondary | ICD-10-CM | POA: Diagnosis not present

## 2020-08-13 NOTE — Patient Instructions (Signed)
Sputum with Gram stain and cultures will be requested  Nebulization treatment with hypertonic saline will help with clearing secretions Mucinex will thin secretions for you   Flutter device will help with clearing secretions  Call with significant concerns  Goal with the sputum culture is to identify specific organism that we need to treat  We will follow up in 4 to 6 weeks

## 2020-08-13 NOTE — Progress Notes (Signed)
Erica Khan    [id number]    02/03/1981  Primary Care Physician:Worley, Lelon Mast, PA  Referring Physician: Jarold Motto, PA [address] [place],  [place] [address]  Chief complaint:   Patient being seen for shortness of breath Issues with some chest discomfort  HPI:  History of chronic shortness of breath Did have a severe respiratory infection at about age 39 left eye with bronchiectasis in the right lung  Uses nebulization treatments Used Mucinex for about 5 days  Has a constant cough with mucus production, greenish phlegm Is not feeling acutely ill at present  Has shortness of breath with wheezing Occasional throat discomfort  She currently is on Symbicort and started using Spiriva  Was evaluated about 2018 in [place] with a chest x-ray and CT scan which I reviewed with the patient showing 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 albuterol (VENTOLIN HFA) 108 (90 Base) MCG/ACT inhaler SMARTSIG:1-2 Puff(s) Via Inhaler Every 4-6 Hours PRN  . albuterol (VENTOLIN HFA) 108 (90 Base) MCG/ACT inhaler Inhale 2 puffs into the lungs every 6 (six) hours as needed.  . budesonide-formoterol (SYMBICORT) 160-4.5 MCG/ACT inhaler Inhale 2 puffs into the lungs 2 (two) times daily.  . sodium chloride 0.9 % nebulizer solution Take 3 mLs by nebulization 2 (two) times daily as needed for wheezing.  . SYMBICORT 160-4.5 MCG/ACT inhaler Inhale 2 puffs into the lungs every 6 (six) hours as needed.   . [DISCONTINUED] montelukast (SINGULAIR) 10 MG tablet Take 1 tablet (10 mg total) by mouth at bedtime.  . [DISCONTINUED] Tiotropium Bromide Monohydrate (SPIRIVA RESPIMAT) 2.5 MCG/ACT AERS Inhale 1 puff into the lungs daily.   No facility-administered encounter medications on file as of 08/13/2020.    Allergies as of 08/13/2020  . (No Known Allergies)    Past Medical History:    Diagnosis Date  . Asthma   . Bronchitis     Past Surgical History:  Procedure Laterality Date  . CESAREAN SECTION  2011    Family History  Problem Relation Age of Onset  . Osteoarthritis Mother   . Cancer Neg Hx     Social History   Socioeconomic History  . Marital status: Single    Spouse name: Not on file  . Number of children: Not on file  . Years of education: Not on file  . Highest education level: Not on file  Occupational History  . Not on file  Tobacco Use  . Smoking status: Never Smoker  . Smokeless tobacco: Never Used  Vaping Use  . Vaping Use: Never used  Substance and Sexual Activity  . Alcohol use: Yes    Comment: occ  . Drug use: Never  . Sexual activity: Yes    Birth control/protection: Condom  Other Topics Concern  . Not on file  Social History Narrative   From [place]   Currently at [employer] to [place] at end of 2020   Social Determinants of Health   Financial Resource Strain:   . Difficulty of Paying Living Expenses: Not on file  Food Insecurity:   . Worried About Programme researcher, broadcasting/film/video in the Last Year: Not on file  . Ran Out of Food in the Last Year: Not on file  Transportation Needs:   . Lack of Transportation (Medical): Not on file  . Lack of Transportation (Non-Medical): Not on file  Physical Activity:   .  Days of Exercise per Week: Not on file  . Minutes of Exercise per Session: Not on file  Stress:   . Feeling of Stress : Not on file  Social Connections:   . Frequency of Communication with Friends and Family: Not on file  . Frequency of Social Gatherings with Friends and Family: Not on file  . Attends Religious Services: Not on file  . Active Member of Clubs or Organizations: Not on file  . Attends Banker Meetings: Not on file  . Marital Status: Not on file  Intimate Partner Violence:   . Fear of Current or Ex-Partner: Not on file  . Emotionally Abused: Not on file  . Physically Abused: Not on file  . Sexually  Abused: Not on file    Review of Systems  Constitutional: Negative for fever.  Respiratory: Positive for cough and shortness of breath.   Cardiovascular: Negative.   Gastrointestinal: Negative.   Musculoskeletal: Negative.  Negative for arthralgias.    Vitals:   08/13/20 1046  BP: 112/68  Temp: (!) 97.2 F (36.2 C)     Physical Exam Constitutional:      Appearance: Normal appearance.  HENT:     Nose: No congestion.     Mouth/Throat:     Pharynx: No oropharyngeal exudate or posterior oropharyngeal erythema.  Eyes:     Pupils: Pupils are equal, round, and reactive to light.  Cardiovascular:     Rate and Rhythm: Normal rate and regular rhythm.     Pulses: Normal pulses.     Heart sounds: Normal heart sounds. No murmur heard.  No friction rub.  Pulmonary:     Effort: Pulmonary effort is normal. No respiratory distress.     Breath sounds: Normal breath sounds. No stridor. No wheezing or rhonchi.  Musculoskeletal:     Cervical back: No rigidity or tenderness.  Neurological:     Mental Status: She is alert.    Data Reviewed:  Patient brought CT scans from 2018 which was reviewed with the patient Chest x-ray is reviewed with the patient  CT scan of the chest reviewed showing bronchiectasis with some secretions in the dependent parts of the lungs  Assessment:  Bronchiectasis -Extensive pneumonia when she was much younger -Chronic process -Mucus clearance is very important -Discussed the use of a flutter device -Discussed use of nebulized hypertonic saline to help break down mucus and aid in expectoration   Asthma -Continue use of Symbicort twice a day -Rescue inhaler use as needed  Shortness of breath -Continue bronchodilators  Significant mucus production -Continue nebulizer use -Hypertonic saline -Mucinex  Plan/Recommendations: -Flutter device to be used regularly -Sputum for Gram stain and cultures -Nebulization with hypertonic saline -Use of  Mucinex may help  -Follow-up in the office in 4 to 6 weeks    Virl Diamond MD [facility] 08/13/2020, 10:59 AM  CC: Jarold Motto, PA  hyper

## 2020-08-21 ENCOUNTER — Telehealth: Payer: Self-pay

## 2020-08-21 ENCOUNTER — Telehealth: Payer: Self-pay | Admitting: Pulmonary Disease

## 2020-08-21 ENCOUNTER — Other Ambulatory Visit: Payer: Self-pay

## 2020-08-21 DIAGNOSIS — J479 Bronchiectasis, uncomplicated: Secondary | ICD-10-CM

## 2020-08-21 NOTE — Telephone Encounter (Signed)
Called and spoke with Selena Batten at [facility] lab and she states she can't see the order that is in the computer and needs it faxed over to her at [phone]. Order from 9/22 has been printed and faxed over. Nothing further needed at this time.

## 2020-08-21 NOTE — Addendum Note (Signed)
Addended by: Lanna Poche on: 08/21/2020 12:57 PM   Modules accepted: Orders

## 2020-08-21 NOTE — Telephone Encounter (Signed)
Needing orders.Caren Griffins

## 2020-08-21 NOTE — Telephone Encounter (Signed)
Multiple encounters. Please see previous encounter. Will close this one.

## 2020-08-21 NOTE — Telephone Encounter (Signed)
Patient calling regarding order for sputum sample she dropped off.  Please advise.  [phone].

## 2020-08-28 ENCOUNTER — Other Ambulatory Visit: Payer: Self-pay

## 2020-08-28 ENCOUNTER — Other Ambulatory Visit: Payer: No Typology Code available for payment source

## 2020-08-28 DIAGNOSIS — J479 Bronchiectasis, uncomplicated: Secondary | ICD-10-CM

## 2020-08-31 LAB — RESPIRATORY CULTURE OR RESPIRATORY AND SPUTUM CULTURE
MICRO NUMBER:: 11044493
SPECIMEN QUALITY:: ADEQUATE

## 2020-09-11 ENCOUNTER — Ambulatory Visit: Payer: No Typology Code available for payment source | Admitting: Pulmonary Disease

## 2020-10-08 ENCOUNTER — Ambulatory Visit (INDEPENDENT_AMBULATORY_CARE_PROVIDER_SITE_OTHER): Payer: No Typology Code available for payment source | Admitting: Pulmonary Disease

## 2020-10-08 ENCOUNTER — Other Ambulatory Visit: Payer: Self-pay

## 2020-10-08 ENCOUNTER — Encounter: Payer: Self-pay | Admitting: Pulmonary Disease

## 2020-10-08 VITALS — BP 122/74 | HR 88 | Temp 97.5°F | Ht 63.0 in | Wt 120.0 lb

## 2020-10-08 DIAGNOSIS — J479 Bronchiectasis, uncomplicated: Secondary | ICD-10-CM

## 2020-10-08 DIAGNOSIS — R059 Cough, unspecified: Secondary | ICD-10-CM

## 2020-10-08 MED ORDER — SYMBICORT 160-4.5 MCG/ACT IN AERO: 2.0000 | INHALATION_SPRAY | Freq: Two times a day (BID) | RESPIRATORY_TRACT | 3 refills | Status: DC

## 2020-10-08 MED ORDER — HYDROCODONE-HOMATROPINE 5-1.5 MG/5ML PO SYRP: 5.0000 mL | ORAL_SOLUTION | Freq: Four times a day (QID) | ORAL | 0 refills | Status: DC | PRN

## 2020-10-08 NOTE — Patient Instructions (Signed)
Bronchiectasis Your last sputum culture did reveal Pseudomonas  We will repeat your sputum culture This may need treated with IV antibiotics  Refilled your Symbicort  Called in a cough medicine that should help with nighttime symptoms  Continue using your nebulizer treatments try and use it at least twice a day to help you clear secretions Use the flutter device to help clear secretions  Clearing secretions as best as you can will help clear the mucus as best as we can hope for  I will see you in about 4 weeks

## 2020-10-08 NOTE — Progress Notes (Signed)
Erica Khan    [id number]    23-Oct-1981  Primary Care Physician:Worley, Lelon Mast, PA  Referring Physician: Jarold Motto, PA [address] [place],  [place] [address]  Chief complaint:   Patient being seen for shortness of breath Some chest discomfort Increased mucus since weather changes  HPI:  Continues with chronic shortness of breath Chronic lung infection started at age 38 with bronchiectasis right lung Recent CT did show some involvement of the left   Uses nebulization treatments-uses hypertonic saline, has not been religious with its use Uses Mucinex intermittently  Since the cold weather she has had more symptoms, no fevers, no chills Not feeling acutely ill  She does have wheezing-Symbicort helps  She does have a regular cough, increased sputum the last few days  Has shortness of breath with wheezing Occasional throat discomfort  Was evaluated about 2018 in [place] with a chest x-ray and CT scan which I reviewed with the patient showing extensive bronchiectatic changes with completely destroyed right lung  She has had bronchoscopies at least twice in [place], does not recollect any organism being cultured  Current culture did reveal Pseudomonas  Outpatient Encounter Medications as of 10/08/2020  Medication Sig  . albuterol (VENTOLIN HFA) 108 (90 Base) MCG/ACT inhaler SMARTSIG:1-2 Puff(s) Via Inhaler Every 4-6 Hours PRN  . albuterol (VENTOLIN HFA) 108 (90 Base) MCG/ACT inhaler Inhale 2 puffs into the lungs every 6 (six) hours as needed.  . budesonide-formoterol (SYMBICORT) 160-4.5 MCG/ACT inhaler Inhale 2 puffs into the lungs 2 (two) times daily.  . sodium chloride 0.9 % nebulizer solution Take 3 mLs by nebulization 2 (two) times daily as needed for wheezing.  . SYMBICORT 160-4.5 MCG/ACT inhaler Inhale 2 puffs into the lungs in the morning and at bedtime.  . [DISCONTINUED] SYMBICORT 160-4.5 MCG/ACT inhaler Inhale 2 puffs into the lungs every 6  (six) hours as needed.   Marland Kitchen HYDROcodone-homatropine (HYCODAN) 5-1.5 MG/5ML syrup Take 5 mLs by mouth every 6 (six) hours as needed for cough.   No facility-administered encounter medications on file as of 10/08/2020.    Allergies as of 10/08/2020  . (No Known Allergies)    Past Medical History:  Diagnosis Date  . Asthma   . Bronchitis     Past Surgical History:  Procedure Laterality Date  . CESAREAN SECTION  2011    Family History  Problem Relation Age of Onset  . Osteoarthritis Mother   . Cancer Neg Hx     Social History   Socioeconomic History  . Marital status: Single    Spouse name: Not on file  . Number of children: Not on file  . Years of education: Not on file  . Highest education level: Not on file  Occupational History  . Not on file  Tobacco Use  . Smoking status: Never Smoker  . Smokeless tobacco: Never Used  Vaping Use  . Vaping Use: Never used  Substance and Sexual Activity  . Alcohol use: Yes    Comment: occ  . Drug use: Never  . Sexual activity: Yes    Birth control/protection: Condom  Other Topics Concern  . Not on file  Social History Narrative   From [place]   Currently at [employer] to [place] at end of 2020   Social Determinants of Health   Financial Resource Strain:   . Difficulty of Paying Living Expenses: Not on file  Food Insecurity:   . Worried About [employer] of  Food in the Last Year: Not on file  . Ran Out of Food in the Last Year: Not on file  Transportation Needs:   . Lack of Transportation (Medical): Not on file  . Lack of Transportation (Non-Medical): Not on file  Physical Activity:   . Days of Exercise per Week: Not on file  . Minutes of Exercise per Session: Not on file  Stress:   . Feeling of Stress : Not on file  Social Connections:   . Frequency of Communication with Friends and Family: Not on file  . Frequency of Social Gatherings with Friends and Family: Not on file  . Attends Religious Services: Not on file   . Active Member of Clubs or Organizations: Not on file  . Attends Banker Meetings: Not on file  . Marital Status: Not on file  Intimate Partner Violence:   . Fear of Current or Ex-Partner: Not on file  . Emotionally Abused: Not on file  . Physically Abused: Not on file  . Sexually Abused: Not on file    Review of Systems  Constitutional: Negative for fever.  Respiratory: Positive for cough and shortness of breath.   Cardiovascular: Negative.     Vitals:   10/08/20 1404  BP: 122/74  Pulse: 88  Temp: (!) 97.5 F (36.4 C)  SpO2: 99%     Physical Exam Constitutional:      Appearance: Normal appearance.  HENT:     Nose: No congestion.     Mouth/Throat:     Pharynx: No oropharyngeal exudate or posterior oropharyngeal erythema.  Eyes:     Pupils: Pupils are equal, round, and reactive to light.  Cardiovascular:     Rate and Rhythm: Normal rate and regular rhythm.     Pulses: Normal pulses.     Heart sounds: Normal heart sounds. No murmur heard.  No friction rub.  Pulmonary:     Effort: Pulmonary effort is normal. No respiratory distress.     Breath sounds: Normal breath sounds. No stridor. No wheezing or rhonchi.  Musculoskeletal:     Cervical back: No rigidity or tenderness.  Neurological:     Mental Status: She is alert.  Psychiatric:        Mood and Affect: Mood normal.    Data Reviewed:  Patient brought CT scans from 2018 which was reviewed with the patient Chest x-ray is reviewed with the patient  CT chest reviewed again today during this visit  Assessment:  Bronchiectasis -Extensive pneumonia when she was much younger -Chronic process -The importance of mucus clearance was again discussed -Use of hypertonic saline and flutter device  -Encouraged to use this more religiously to keep airway clean  Pseudomonas bronchiectasis -Cultures reviewed with the patient -Discussed the need for treatment -We will send another sputum culture off to  ensure that she still has the Pseudomonas that needs treated -The importance of treating was discussed -She is functioning relatively well apart from an exacerbation of her symptoms  History of asthma -She will continue with Symbicort use twice a day -Albuterol as needed  For mucus clearance -Nebulization with hypertonic saline -Flutter device use  We will give her a course of Hycodan for cough to help with resting at night  Plan/Recommendations: .  Continue regular use of flutter device .  We will repeat sputum Gram stain and cultures .  Continue Mucinex as needed .  Hypertonic saline on a regular basis .  Refill for Symbicort  .  We will  follow up in 4 to 6 weeks  .  Need for treatment for the Pseudomonas discussed  .check for Mycobacteria  Virl Diamond MD [facility] 10/08/2020, 3:31 PM  CC: Jarold Motto, PA

## 2020-10-09 ENCOUNTER — Telehealth: Payer: Self-pay | Admitting: Pulmonary Disease

## 2020-10-09 ENCOUNTER — Other Ambulatory Visit: Payer: Self-pay | Admitting: Pulmonary Disease

## 2020-10-09 MED ORDER — BUDESONIDE-FORMOTEROL FUMARATE 160-4.5 MCG/ACT IN AERO: 2.0000 | INHALATION_SPRAY | Freq: Two times a day (BID) | RESPIRATORY_TRACT | 3 refills | Status: DC

## 2020-10-09 NOTE — Telephone Encounter (Signed)
Spoke with the pt and she states has already spoken with Dr Wynona Neat and made aware of all of this  She already has consult with Dr Luciana Axe with ID for 10/15/20 Nothing further needed

## 2020-10-09 NOTE — Telephone Encounter (Signed)
Refer to infectious disease  Pseudomonas bronchiectasis    Let patient know that I sent in replacement prescription for generic Symbicort

## 2020-10-14 ENCOUNTER — Other Ambulatory Visit: Payer: No Typology Code available for payment source

## 2020-10-14 DIAGNOSIS — R059 Cough, unspecified: Secondary | ICD-10-CM

## 2020-10-14 DIAGNOSIS — J479 Bronchiectasis, uncomplicated: Secondary | ICD-10-CM

## 2020-10-15 ENCOUNTER — Telehealth: Payer: Self-pay

## 2020-10-15 ENCOUNTER — Encounter: Payer: Self-pay | Admitting: Internal Medicine

## 2020-10-15 ENCOUNTER — Ambulatory Visit (INDEPENDENT_AMBULATORY_CARE_PROVIDER_SITE_OTHER): Payer: No Typology Code available for payment source | Admitting: Internal Medicine

## 2020-10-15 ENCOUNTER — Other Ambulatory Visit: Payer: Self-pay | Admitting: Pharmacist

## 2020-10-15 ENCOUNTER — Other Ambulatory Visit: Payer: Self-pay

## 2020-10-15 DIAGNOSIS — J470 Bronchiectasis with acute lower respiratory infection: Secondary | ICD-10-CM | POA: Diagnosis not present

## 2020-10-15 DIAGNOSIS — J479 Bronchiectasis, uncomplicated: Secondary | ICD-10-CM | POA: Insufficient documentation

## 2020-10-15 DIAGNOSIS — A498 Other bacterial infections of unspecified site: Secondary | ICD-10-CM

## 2020-10-15 DIAGNOSIS — J471 Bronchiectasis with (acute) exacerbation: Secondary | ICD-10-CM | POA: Insufficient documentation

## 2020-10-15 MED ORDER — TOBRAMYCIN 300 MG/5ML IN NEBU: 300.0000 mg | INHALATION_SOLUTION | Freq: Two times a day (BID) | RESPIRATORY_TRACT | 0 refills | Status: DC

## 2020-10-15 MED ORDER — TOBRAMYCIN 28 MG IN CAPS: 112.0000 mg | ORAL_CAPSULE | Freq: Two times a day (BID) | RESPIRATORY_TRACT | 0 refills | Status: DC

## 2020-10-15 MED ORDER — TOBRAMYCIN 300 MG/4ML IN NEBU: 300.0000 mg | INHALATION_SOLUTION | Freq: Two times a day (BID) | RESPIRATORY_TRACT | 0 refills | Status: DC

## 2020-10-15 MED FILL — TOBI PODHALER 28 MG CAPS: 28 | 28 days supply | Qty: 224 | Fill #0

## 2020-10-15 NOTE — Progress Notes (Signed)
Resending Rx to [facility] as it is cheaper than [employer]. They are ordering medication for patient today.

## 2020-10-15 NOTE — Progress Notes (Signed)
HPI: Erica Khan is a 39 y.o. female who presents to the [facility] pharmacy clinic for Pseudomonas infection.  Patient Active Problem List   Diagnosis Date Noted  . Bronchiectasis (HCC) 10/15/2020  . Pseudomonas aeruginosa infection 10/15/2020    Patient's Medications  New Prescriptions   No medications on file  Previous Medications   ALBUTEROL (VENTOLIN HFA) 108 (90 BASE) MCG/ACT INHALER    SMARTSIG:1-2 Puff(s) Via Inhaler Every 4-6 Hours PRN   ALBUTEROL (VENTOLIN HFA) 108 (90 BASE) MCG/ACT INHALER    Inhale 2 puffs into the lungs every 6 (six) hours as needed.   BUDESONIDE-FORMOTEROL (SYMBICORT) 160-4.5 MCG/ACT INHALER    Inhale 2 puffs into the lungs 2 (two) times daily.   BUDESONIDE-FORMOTEROL (SYMBICORT) 160-4.5 MCG/ACT INHALER    Inhale 2 puffs into the lungs in the morning and at bedtime.   HYDROCODONE-HOMATROPINE (HYCODAN) 5-1.5 MG/5ML SYRUP    Take 5 mLs by mouth every 6 (six) hours as needed for cough.   SODIUM CHLORIDE 0.9 % NEBULIZER SOLUTION    Take 3 mLs by nebulization 2 (two) times daily as needed for wheezing.  Modified Medications   No medications on file  Discontinued Medications   No medications on file    Allergies: No Known Allergies  Past Medical History: Past Medical History:  Diagnosis Date  . Asthma   . Bronchitis     Social History: Social History   Socioeconomic History  . Marital status: Single    Spouse name: Not on file  . Number of children: Not on file  . Years of education: Not on file  . Highest education level: Not on file  Occupational History  . Not on file  Tobacco Use  . Smoking status: Never Smoker  . Smokeless tobacco: Never Used  Vaping Use  . Vaping Use: Never used  Substance and Sexual Activity  . Alcohol use: Yes    Comment: occ  . Drug use: Never  . Sexual activity: Yes    Birth control/protection: Condom  Other Topics Concern  . Not on file  Social History Narrative   From [place]   Currently at [employer]  to [place] at end of 2020   Social Determinants of Health   Financial Resource Strain:   . Difficulty of Paying Living Expenses: Not on file  Food Insecurity:   . Worried About Programme researcher, broadcasting/film/video in the Last Year: Not on file  . Ran Out of Food in the Last Year: Not on file  Transportation Needs:   . Lack of Transportation (Medical): Not on file  . Lack of Transportation (Non-Medical): Not on file  Physical Activity:   . Days of Exercise per Week: Not on file  . Minutes of Exercise per Session: Not on file  Stress:   . Feeling of Stress : Not on file  Social Connections:   . Frequency of Communication with Friends and Family: Not on file  . Frequency of Social Gatherings with Friends and Family: Not on file  . Attends Religious Services: Not on file  . Active Member of Clubs or Organizations: Not on file  . Attends Banker Meetings: Not on file  . Marital Status: Not on file    Labs: No results found for: HIV1RNAQUANT, HIV1RNAVL, CD4TABS  RPR and STI No results found for: LABRPR, RPRTITER  No flowsheet data found.  Hepatitis B No results found for: HEPBSAB, HEPBSAG, HEPBCAB Hepatitis C No results found for: HEPCAB, HCVRNAPCRQN Hepatitis  A No results found for: HAV Lipids: No results found for: CHOL, TRIG, HDL, CHOLHDL, VLDL, LDLCALC   Assessment: Erica Khan presents to clinic today to discuss Pseudomonas treatment options. Dr. Luciana Axe will be treating her with 2 weeks of IV therapy and 28 days of inhaled tobramycin. Discussed utilizing twice daily and administering after she uses her Symbicort. Discussed patients may experience cough when taking. Explained that inhaled tobramycin should not cause systemic toxicities such as nephrotoxicity or ototoxicity as it is localized in the lungs. Provided patient with copay card and told her to call the clinic if unable to utilize as we may be able to work on further copay assistance for her.   Plan: Initiate tobramycin  nebulized solution Counseled patient on administration and side effects  Margarite Gouge, PharmD PGY2 ID Pharmacy Resident [phone]  10/15/2020, 11:31 AM

## 2020-10-15 NOTE — Telephone Encounter (Signed)
I spoke with patient and advised her of her picc placement appointment on 11/03/20, arrival time 11:30 pm and location. Patient verbalized understanding.  Orders have also been faxed to [facility] and Debbie with [facility] has been advised of patient's appointment date, time and patient will get first dose at short stay.  Debbie verbalized understanding.  Orders have also been faxed to short stay for patient's first dose.  Dexton Zwilling T Pricilla Loveless

## 2020-10-15 NOTE — Telephone Encounter (Signed)
[facility] Patient Advocate Encounter   Was successful in obtaining a [employer] copay card for TOBI PODHALER.  This copay card will make the patients copay $0.00.  I have spoken with the patient.    The billing information is as follows and has been shared with [facility].        Erica Khan, CPhT Specialty Pharmacy Patient [facility] Phone: [phone] Fax:  [phone]

## 2020-10-15 NOTE — Progress Notes (Signed)
Patient ID: Erica Khan, female   DOB: 08-13-1981, 39 y.o.   MRN: [id number]     [facility]      Reason for Consult: Pseudomonas infection    Referring Physician: Dr. Wynona Neat    Patient ID: Erica Khan, female    DOB: 02/10/1981, 39 y.o.   MRN: [id number]  HPI:   She is here for evaluation of bronchiectasis with Pseudomonas infection. She has a long history of bronchiectasis in her right lung and followed by pulmonary.  She has had previous evaluation including bronchoscopies in [place].  She has had some exacerbations but less than last year so far.  No fever, no hemoptysis, no weight loss.  Recent sputum with Pseudomonas, cipro/levaquin resistant.  Asked by Dr. Wynona Neat about treatment for the Pseudomonas.    Past Medical History:  Diagnosis Date  . Asthma   . Bronchitis     Prior to Admission medications   Medication Sig Start Date End Date Taking? Authorizing Provider  albuterol (VENTOLIN HFA) 108 (90 Base) MCG/ACT inhaler Inhale 2 puffs into the lungs every 6 (six) hours as needed. 07/11/20  Yes Olalere, Adewale A, MD  budesonide-formoterol (SYMBICORT) 160-4.5 MCG/ACT inhaler Inhale 2 puffs into the lungs 2 (two) times daily. 07/11/20  Yes Olalere, Adewale A, MD  HYDROcodone-homatropine (HYCODAN) 5-1.5 MG/5ML syrup Take 5 mLs by mouth every 6 (six) hours as needed for cough. 10/08/20  Yes Olalere, Adewale A, MD  sodium chloride 0.9 % nebulizer solution Take 3 mLs by nebulization 2 (two) times daily as needed for wheezing. 07/11/20  Yes Olalere, Adewale A, MD  albuterol (VENTOLIN HFA) 108 (90 Base) MCG/ACT inhaler SMARTSIG:1-2 Puff(s) Via Inhaler Every 4-6 Hours PRN Patient not taking: Reported on 10/15/2020 01/16/20   [provider]  budesonide-formoterol (SYMBICORT) 160-4.5 MCG/ACT inhaler Inhale 2 puffs into the lungs in the morning and at bedtime. Patient not taking: Reported on 10/15/2020 10/09/20   Virl Diamond A, MD  tobramycin, PF, (TOBI)  300 MG/5ML nebulizer solution Take 5 mLs (300 mg total) by nebulization 2 (two) times daily. 10/15/20   Kristi Hyer, Belia Heman, MD    No Known Allergies  Social History   Tobacco Use  . Smoking status: Never Smoker  . Smokeless tobacco: Never Used  Vaping Use  . Vaping Use: Never used  Substance Use Topics  . Alcohol use: Yes    Comment: occ  . Drug use: Never    Family History  Problem Relation Age of Onset  . Osteoarthritis Mother   . Cancer Neg Hx     Review of Systems  Constitutional: negative for fevers, chills, malaise and anorexia Respiratory: positive for cough, sputum, pleurisy/chest pain or dyspnea on exertion, negative for hemoptysis Gastrointestinal: negative for nausea and diarrhea Integument/breast: negative for rash All other systems reviewed and are negative    Constitutional: in no apparent distress  Vitals:   10/15/20 0859  BP: 107/71  Pulse: 76  Temp: 98.3 F (36.8 C)   EYES: anicteric Cardiovascular: Cor RRR Respiratory: right side with significant rhonchi, , left with some crackles Musculoskeletal: no pedal edema noted Skin: negatives: no rash Neuro: non-focal  Labs: No results found for: WBC, HGB, HCT, MCV, PLT No results found for: CREATININE, BUN, NA, K, CL, CO2 No results found for: ALT, AST, GGT, ALKPHOS, BILITOT, INR   Assessment: Pseudomonas infection with colonization.  I discussed the treatment options, IV +/- inhaled tobramycin.  Will try for 2 weeks of IV antibiotics and inhaled tobi.  Appears to be covered by her insurance. She wants to wait until after school ends December 10.   Plan: 1) picc line week of December 13 2) cefepime 2 grams three times a day for 14 days 3) inhaled tobi for 28 days Follow up in about 3 weeks on treatment

## 2020-10-15 NOTE — Telephone Encounter (Addendum)
I spoke with Victorino Dike with IR to schedule picc line placement. Victorino Dike informed patient will need to get first dose at short stay.  Patient is scheduled for 11/03/20 arrival time 11:30 am for a 12:00pm appointment. Patient has requested to be scheduled after 10/31/20.  Girlie Veltri T Pricilla Loveless

## 2020-10-20 ENCOUNTER — Other Ambulatory Visit: Payer: No Typology Code available for payment source

## 2020-10-20 ENCOUNTER — Telehealth: Payer: Self-pay | Admitting: Pulmonary Disease

## 2020-10-20 DIAGNOSIS — R059 Cough, unspecified: Secondary | ICD-10-CM

## 2020-10-20 DIAGNOSIS — J479 Bronchiectasis, uncomplicated: Secondary | ICD-10-CM

## 2020-10-20 NOTE — Telephone Encounter (Signed)
AO pt is requesting the results of the sputum culture.  I did not see anything posted to the pts chart.  Please advise. Thanks

## 2020-10-20 NOTE — Telephone Encounter (Signed)
Is there anyone avail to help this pt....ibuprofen know there is no triage but pt has been waiting 30 mins or more.Caren Griffins

## 2020-10-20 NOTE — Telephone Encounter (Signed)
Spoke with patient. She is worried about bulking up since the medication is a steroid. I ask her if she would like to speak with a pharmacist she did not want to.  I explained to her this is not a medication we usually have in the clinic and a pharmacist would be able to help her out with administration and understanding to answer questions.   Patient still said no and decided she would call back if she needed anything further.

## 2020-10-21 NOTE — Telephone Encounter (Signed)
Per Corrie Dandy at [facility], patient's copay is $39.74/day. She will notify the patient. Andree Coss, RN

## 2020-10-24 ENCOUNTER — Telehealth: Payer: Self-pay | Admitting: Pulmonary Disease

## 2020-10-24 NOTE — Telephone Encounter (Signed)
Patient called back and was advised by Johny Drilling that Per chart her sputum cultures from 11/23 and 11/29 are still pending and not yet completed. Patient expressed understanding. Nothing further needed at this time.

## 2020-10-24 NOTE — Telephone Encounter (Signed)
Dr. Val Eagle, please see mychart message sent by pt as an FYI:  To: [facility] POOL    From: Lou Cal    Created: 10/23/2020 8:10 PM     *-*-*This message was handled on 10/24/2020 9:00 AM by Tiena Manansala P*-*-*  Hi Doc,  Hope you are doing great. I am feeling a lot better after starting my pseudmonus treatment. It's so much easier to breath now!! My sputum tests reports have not come yet. I gave samples on 23rd and 29th of Nov. Hope it is published before my appointment with you on dec 10th.  Thanks  See you!!

## 2020-10-24 NOTE — Telephone Encounter (Signed)
lmtcb for pt. Per chart her sputum cultures from 11/23 and 11/29 are still pending and not yet completed.

## 2020-10-26 LAB — RESPIRATORY CULTURE OR RESPIRATORY AND SPUTUM CULTURE
MICRO NUMBER:: 11251741
SPECIMEN QUALITY:: ADEQUATE

## 2020-10-29 ENCOUNTER — Telehealth: Payer: Self-pay | Admitting: Pulmonary Disease

## 2020-10-29 NOTE — Telephone Encounter (Signed)
Patient is aware of results.  She voiced her understanding and had no further questions.

## 2020-10-31 ENCOUNTER — Ambulatory Visit (INDEPENDENT_AMBULATORY_CARE_PROVIDER_SITE_OTHER): Payer: No Typology Code available for payment source | Admitting: Pulmonary Disease

## 2020-10-31 ENCOUNTER — Other Ambulatory Visit (HOSPITAL_COMMUNITY): Payer: Self-pay | Admitting: *Deleted

## 2020-10-31 ENCOUNTER — Ambulatory Visit: Payer: No Typology Code available for payment source | Admitting: Pulmonary Disease

## 2020-10-31 ENCOUNTER — Other Ambulatory Visit: Payer: Self-pay

## 2020-10-31 ENCOUNTER — Encounter: Payer: Self-pay | Admitting: Pulmonary Disease

## 2020-10-31 VITALS — BP 104/70 | HR 84 | Temp 97.9°F | Ht 63.0 in | Wt 120.1 lb

## 2020-10-31 DIAGNOSIS — J479 Bronchiectasis, uncomplicated: Secondary | ICD-10-CM

## 2020-10-31 NOTE — Patient Instructions (Signed)
Continue with current treatment  Good luck with the IV antibiotics To be started soon  Call with any significant concerns  I will see you 6 weeks from now

## 2020-10-31 NOTE — Progress Notes (Signed)
Erica Khan    [id number]    14-Jun-1981  Primary Care Physician:Khan, Erica Mast, PA  Referring Physician: Jarold Motto, PA [address] [place],  [place] [address]  Chief complaint:   Follow-up for bronchiectasis  HPI: Started on tobramycin nebulizations  Plan is to start IV antibiotics on 12/13  Following up with infectious disease Appreciate infectious disease help with management of bronchiectasis  She is feeling great, 80% better she stated Some shortness of breath with activity but much better than when she was doing before  Sputum that showed Pseudomonas-sensitive to cefepime Sputum sent into [facility] still showing Pseudomonas, sensitivities could not be performed  Historically Chronic lung infection started at age 26 with bronchiectasis right lung Recent CT did show some involvement of the left  Uses nebulization treatments-uses hypertonic saline, has not been religious with its use Uses Mucinex intermittently  Since the cold weather she has had more symptoms, no fevers, no chills Not feeling acutely ill  She does have wheezing-Symbicort helps  She does have a regular cough, increased sputum the last few days  Has shortness of breath with wheezing Occasional throat discomfort  Was evaluated about 2018 in [place] with a chest x-ray and CT scan which I reviewed with the patient showing extensive bronchiectatic changes with completely destroyed right lung  She has had bronchoscopies at least twice in [place], does not recollect any organism being cultured  Current culture did reveal Pseudomonas  Outpatient Encounter Medications as of 10/31/2020  Medication Sig  . albuterol (VENTOLIN HFA) 108 (90 Base) MCG/ACT inhaler Inhale 2 puffs into the lungs every 6 (six) hours as needed.  . budesonide-formoterol (SYMBICORT) 160-4.5 MCG/ACT inhaler Inhale 2 puffs into the lungs 2 (two) times daily.  . sodium chloride 0.9 % nebulizer solution Take 3 mLs by  nebulization 2 (two) times daily as needed for wheezing.  . Tobramycin 28 MG CAPS Place 112 mg into inhaler and inhale in the morning and at bedtime.  . [DISCONTINUED] albuterol (VENTOLIN HFA) 108 (90 Base) MCG/ACT inhaler SMARTSIG:1-2 Puff(s) Via Inhaler Every 4-6 Hours PRN (Patient not taking: No sig reported)  . [DISCONTINUED] budesonide-formoterol (SYMBICORT) 160-4.5 MCG/ACT inhaler Inhale 2 puffs into the lungs in the morning and at bedtime. (Patient not taking: No sig reported)  . [DISCONTINUED] HYDROcodone-homatropine (HYCODAN) 5-1.5 MG/5ML syrup Take 5 mLs by mouth every 6 (six) hours as needed for cough. (Patient not taking: Reported on 10/31/2020)   No facility-administered encounter medications on file as of 10/31/2020.    Allergies as of 10/31/2020  . (No Known Allergies)    Past Medical History:  Diagnosis Date  . Asthma   . Bronchitis     Past Surgical History:  Procedure Laterality Date  . CESAREAN SECTION  2011    Family History  Problem Relation Age of Onset  . Osteoarthritis Mother   . Cancer Neg Hx     Social History   Socioeconomic History  . Marital status: Single    Spouse name: Not on file  . Number of children: Not on file  . Years of education: Not on file  . Highest education level: Not on file  Occupational History  . Not on file  Tobacco Use  . Smoking status: Never Smoker  . Smokeless tobacco: Never Used  Vaping Use  . Vaping Use: Never used  Substance and Sexual Activity  . Alcohol use: Yes    Comment: occ  . Drug use: Never  .  Sexual activity: Yes    Birth control/protection: Condom  Other Topics Concern  . Not on file  Social History Narrative   From [place]   Currently at [employer] to [place] at end of 2020   Social Determinants of Corporate investment banker Strain: Not on [employer] Insecurity: Not on file  Transportation Needs: Not on file  Physical Activity: Not on file  Stress: Not on file  Social Connections: Not on  file  Intimate Partner Violence: Not on file    Review of Systems  Constitutional: Negative for fever.  Respiratory: Positive for cough and shortness of breath.   Cardiovascular: Negative.     Vitals:   10/31/20 1457  BP: 104/70  Pulse: 84  Temp: 97.9 F (36.6 C)  SpO2: 96%     Physical Exam Constitutional:      Appearance: Normal appearance.  HENT:     Nose: No congestion or rhinorrhea.     Mouth/Throat:     Pharynx: No oropharyngeal exudate or posterior oropharyngeal erythema.  Cardiovascular:     Rate and Rhythm: Normal rate and regular rhythm.     Pulses: Normal pulses.     Heart sounds: Normal heart sounds. No murmur heard. No friction rub.  Pulmonary:     Effort: Pulmonary effort is normal. No respiratory distress.     Breath sounds: Normal breath sounds. No stridor. No wheezing or rhonchi.  Musculoskeletal:     Cervical back: No rigidity or tenderness.  Neurological:     Mental Status: She is alert.  Psychiatric:        Mood and Affect: Mood normal.    Data Reviewed:  Patient brought CT scans from 2018 which was reviewed with the patient Chest x-ray is reviewed with the patient  CT chest reviewed again today during this visit  Sputum cultures reviewed Recent sputum sample sent to [facility] lab shows Pseudomonas, no sensitivities  Assessment:  Bronchiectasis .  Clinically better .  Continue efforts at mucus clearance. . Hypertonic saline use and flutter device .  Has been using Symbicort prior to nebulization treatments with tobramycin  Pseudomonas bronchiectasis .  Sensitive to cefepime .  Plan is to start IV antibiotics .  Goal will be to be able to clear Pseudomonas from [place]  History of asthma .  Continue Symbicort .  Albuterol as needed  For mucus clearance -Nebulization with hypertonic saline -Flutter device use  We will give her a course of Hycodan for cough to help with resting at night  Plan/Recommendations: .  Continue  regular use of flutter device .  I will contact Dr.Comer .  We will repeat sputum Gram stain and cultures .  Continue Mucinex as needed .  Hypertonic saline on a regular basis  .  Follow-up in 6 weeks  Erica Diamond MD [facility] 10/31/2020, 3:04 PM  CC: Erica Motto, PA

## 2020-11-03 ENCOUNTER — Ambulatory Visit (HOSPITAL_COMMUNITY)
Admission: RE | Admit: 2020-11-03 | Discharge: 2020-11-03 | Disposition: A | Discharge status: Home / Self Care | Payer: PRIVATE HEALTH INSURANCE | Source: Ambulatory Visit | Attending: Internal Medicine | Admitting: Internal Medicine

## 2020-11-03 ENCOUNTER — Encounter (HOSPITAL_COMMUNITY)
Admission: RE | Admit: 2020-11-03 | Discharge: 2020-11-03 | Disposition: A | Discharge status: Home / Self Care | Payer: PRIVATE HEALTH INSURANCE | Source: Ambulatory Visit | Attending: Internal Medicine | Admitting: Internal Medicine

## 2020-11-03 ENCOUNTER — Other Ambulatory Visit: Payer: Self-pay

## 2020-11-03 DIAGNOSIS — J470 Bronchiectasis with acute lower respiratory infection: Secondary | ICD-10-CM | POA: Insufficient documentation

## 2020-11-03 DIAGNOSIS — A498 Other bacterial infections of unspecified site: Secondary | ICD-10-CM | POA: Insufficient documentation

## 2020-11-03 DIAGNOSIS — Z452 Encounter for adjustment and management of vascular access device: Secondary | ICD-10-CM | POA: Insufficient documentation

## 2020-11-03 DIAGNOSIS — J479 Bronchiectasis, uncomplicated: Secondary | ICD-10-CM | POA: Insufficient documentation

## 2020-11-03 MED ORDER — SODIUM CHLORIDE 0.9 % IV SOLN
2.0000 g | Freq: Once | INTRAVENOUS | Status: AC
  Administered 2020-11-03: 2 g via INTRAVENOUS
  Filled 2020-11-03 (×2): qty 2

## 2020-11-03 MED ORDER — HEPARIN SOD (PORK) LOCK FLUSH 100 UNIT/ML IV SOLN: 250.0000 [IU] | Freq: Every day | INTRAVENOUS | Status: DC

## 2020-11-03 MED ORDER — HEPARIN SOD (PORK) LOCK FLUSH 100 UNIT/ML IV SOLN
INTRAVENOUS | Status: AC
  Administered 2020-11-03: 250 [IU]
  Filled 2020-11-03: qty 5

## 2020-11-03 MED ORDER — LIDOCAINE HCL 1 % IJ SOLN
INTRAMUSCULAR | Status: AC
  Filled 2020-11-03: qty 20

## 2020-11-03 MED ORDER — HEPARIN SOD (PORK) LOCK FLUSH 100 UNIT/ML IV SOLN: 250.0000 [IU] | INTRAVENOUS | Status: DC | PRN

## 2020-11-03 NOTE — Discharge Instructions (Signed)
Cefepime Injection What is this medicine? CEFEPIME (SEF e pim) is a cephalosporin antibiotic. It treats some infections caused by bacteria. It will not work for colds, the flu, or other viruses. This medicine may be used for other purposes; ask your health care provider or pharmacist if you have questions. COMMON BRAND NAME(S): Maxipime What should I tell my health care provider before I take this medicine? They need to know if you have any of these conditions:  bleeding problems  kidney disease  stomach, intestinal problems like colitis  an unusual or allergic reaction to cefepime, other cephalosporin or penicillin antibiotics, imipenem, other foods, dyes or preservatives  pregnant or trying to get pregnant  breast-feeding How should I use this medicine? This drug is injected into a muscle or a vein. It is usually given by a health care provider in a hospital or clinic setting. If you get this drug at home, you will be taught how to prepare and give it. Use exactly as directed. Take it as directed on the prescription label at the same time every day. Keep taking it unless your health care provider tells you to stop. It is important that you put your used needles and syringes in a special sharps container. Do not put them in a trash can. If you do not have a sharps container, call your pharmacist or health care provider to get one. Talk to your health care provider about the use of this drug in children. While it may be prescribed for children as young as 2 months for selected conditions, precautions do apply. Overdosage: If you think you have taken too much of this medicine contact a poison control center or emergency room at once. NOTE: This medicine is only for you. Do not share this medicine with others. What if I miss a dose? It is important not to miss your dose. Call your health care provider if you are unable to keep an appointment. If you give yourself this drug at home and you  miss a dose, take it as soon as you can. If it is almost time for your next dose, take only that dose. Do not take double or extra doses. What may interact with this medicine? This medicine may interact with the following medications:  birth control pills  certain medicines for infection like amikacin, gentamicin, tobramycin  diuretics This list may not describe all possible interactions. Give your health care provider a list of all the medicines, herbs, non-prescription drugs, or dietary supplements you use. Also tell them if you smoke, drink alcohol, or use illegal drugs. Some items may interact with your medicine. What should I watch for while using this medicine? Tell your doctor or health care provider if your symptoms do not begin to improve or if you get new symptoms. Your doctor will monitor your condition and blood work as needed. This medicine may cause serious skin reactions. They can happen weeks to months after starting the medicine. Contact your health care provider right away if you notice fevers or flu-like symptoms with a rash. The rash may be red or purple and then turn into blisters or peeling of the skin. Or, you might notice a red rash with swelling of the face, lips or lymph nodes in your neck or under your arms. Do not treat diarrhea with over-the-counter products. Contact your doctor if you have diarrhea that lasts more than 2 days or if the diarrhea is severe and watery. This medicine can interfere with some urine  glucose tests. If you use such tests, talk with your health care provider. What side effects may I notice from receiving this medicine? Side effects that you should report to your doctor or health care professional as soon as possible:  allergic reactions like skin rash, itching or hives, swelling of the face, lips, or tongue  confusion, hallucinations  difficulty breathing, wheezing  fever  pain or difficulty passing urine  redness, blistering, peeling  or loosening of the skin, including inside the mouth  seizures  stiff, rigid muscles  unusual bleeding, bruising  unusually weak or tired Side effects that usually do not require medical attention (report to your doctor or health care professional if they continue or are bothersome):  diarrhea  headache  mouth sores, irritation  nausea, vomiting  pain, swelling and irritation at the injection site This list may not describe all possible side effects. Call your doctor for medical advice about side effects. You may report side effects to FDA at 1-800-FDA-1088. Where should I keep my medicine? Keep out of the reach of children and pets. You will be instructed on how to store this drug. Protect from light. Throw away any unused drug after the expiration date. NOTE: This sheet is a summary. It may not cover all possible information. If you have questions about this medicine, talk to your doctor, pharmacist, or health care provider.  2020 Elsevier/Gold Standard (2019-06-14 12:17:50)  

## 2020-11-03 NOTE — Procedures (Signed)
  Procedure: R arm PICC placement   EBL:   minimal Complications:  none immediate  See full dictation in [employer].  Thora Lance MD Main # [phone] Pager  [phone] Mobile [phone]

## 2020-11-05 ENCOUNTER — Encounter: Payer: Self-pay | Admitting: Internal Medicine

## 2020-11-05 ENCOUNTER — Telehealth: Payer: Self-pay

## 2020-11-05 ENCOUNTER — Ambulatory Visit (INDEPENDENT_AMBULATORY_CARE_PROVIDER_SITE_OTHER): Payer: No Typology Code available for payment source | Admitting: Internal Medicine

## 2020-11-05 ENCOUNTER — Other Ambulatory Visit: Payer: Self-pay

## 2020-11-05 VITALS — BP 112/76 | HR 67 | Temp 97.6°F | Wt 122.0 lb

## 2020-11-05 DIAGNOSIS — A498 Other bacterial infections of unspecified site: Secondary | ICD-10-CM

## 2020-11-05 DIAGNOSIS — J479 Bronchiectasis, uncomplicated: Secondary | ICD-10-CM

## 2020-11-05 DIAGNOSIS — Z452 Encounter for adjustment and management of vascular access device: Secondary | ICD-10-CM

## 2020-11-05 NOTE — Telephone Encounter (Signed)
Per MD called [facility] with orders to pull picc after last dose of IV antibiotics. Spoke with Erica Khan who will update nursing. Also requested alternative tape be used for dressing due to patient complaining of itching.  Tim will confirm with nursing if this can be done. Lorenso Courier, [place]

## 2020-11-05 NOTE — Progress Notes (Signed)
   Subjective:    Patient ID: Erica Khan, female    DOB: [birth year], 39 y.o.   MRN: [id number]  HPI Here for follow up for Pseudomonas infection She started the inhaled tobramycin about 2 weeks ago and recently IV cefepime for 2 weeks.  She feels much better, even before starting the IV cefepime.  She is no longer coughing, sleeping better and more active.  Some itchiness with the picc covering.  No fever, no chills.  No rash or diarrhea.     Review of Systems  Constitutional: Negative for fatigue, fever and unexpected weight change.  Gastrointestinal: Negative for diarrhea.  Skin: Negative for rash.       Objective:   Physical Exam Constitutional:      Appearance: Normal appearance.  Eyes:     General: No scleral icterus. Pulmonary:     Effort: Pulmonary effort is normal. No respiratory distress.     Breath sounds: Normal breath sounds. No wheezing.  Neurological:     Mental Status: She is alert.  Psychiatric:        Mood and Affect: Mood normal.   SH: no tobacco        Assessment & Plan:

## 2020-11-05 NOTE — Assessment & Plan Note (Signed)
Much improved with no recent exacerbations following treatment for Pseudomonas.

## 2020-11-05 NOTE — Assessment & Plan Note (Signed)
She is improving well on treatment for this.  No further cough.  She will complete 4 weeks of the inhaled tobi and 2 weeks IV cefepime.  I will then reevaluate her at the end of January and if she has any new symptoms or any sputum growth post treatment with Pseudomonas, will consider an additional 4 weeks of inhaled tobi.   rtc in 6 weeks.

## 2020-11-05 NOTE — Assessment & Plan Note (Signed)
picc line place and some itching at the tape site.   Will have this removed at the end of the 14 days of treatment

## 2020-11-06 ENCOUNTER — Telehealth: Payer: Self-pay

## 2020-11-06 ENCOUNTER — Telehealth: Payer: Self-pay | Admitting: Pulmonary Disease

## 2020-11-06 NOTE — Telephone Encounter (Signed)
lmtcb for pt. Needs to speak to someone when she calls back.

## 2020-11-06 NOTE — Telephone Encounter (Signed)
Yes, stop the cefepime.  See if it can be changed to meropenem 1 gram three times a day.  thanks

## 2020-11-06 NOTE — Telephone Encounter (Signed)
Received call back from patient. She states she started feeling itchy in her hair and on her back last night. This morning she noticed a "very light pink" rash that is flat. She states it itches and burns some. Rash is on her arms, legs, torso, and back. Patient denies any symptoms of shortness of breath. She complains of mild right-sided chest pain "2/10."   Patient wondering if she should administer the other 2 doses today, RN advised her to hold antibiotic until we hear back from Dr. Luciana Axe. Patient verbalized understanding.   Sandie Ano, RN

## 2020-11-06 NOTE — Telephone Encounter (Signed)
Receive call [facility] regarding patinet IV therapy medication. Patient started on cefepime and was seen in the office 12/15 but has now developed a rash/hives on torso, both arms. Attempted to follow up with patient, but no answer. Left patient a voicemail to return call.  Routing to Dr. Luciana Axe as well. Valarie Cones

## 2020-11-06 NOTE — Telephone Encounter (Signed)
Patient states that rash has gone down since this morning. With this new information, Dr. Luciana Axe would like to keep the patient on cefepime as previously prescribed and would like the patient to take 25 mg Benadryl PO before her administration of the cefepime.   RN called to let patient know that we will keep her on the cefepime for now. RN advised patient that Dr. Luciana Axe would like for her to try 25 mg Benadryl PO before administering her antibiotic to see if this helps the rash. RN instructed patient to notify us if her rash worsens. Instructed patient to call 911 in an emergent situation if she starts to have wheezing, hives, or difficulty breathing. Patient verbalized understanding and has no further questions.   RN updated [facility] with [facility] to let her know we will continue with the cefepime as previously prescribed.   Sandie Ano, RN

## 2020-11-06 NOTE — Telephone Encounter (Signed)
lmtcb for pt.  

## 2020-11-06 NOTE — Telephone Encounter (Signed)
Patient is returning phone call. Patient phone number is [phone].

## 2020-11-07 NOTE — Telephone Encounter (Signed)
lmtcb for pt. Pt needs to speak with nurse when calling back. Do not put message back.

## 2020-11-08 ENCOUNTER — Telehealth: Payer: Self-pay | Admitting: Infectious Disease

## 2020-11-08 NOTE — Telephone Encounter (Signed)
Patient reports having developed a rash that has progressed and is diffuse while taking cefepime. Today as she continued to take the cefepime she noticed that she was beginning to wheeze  She has stopped the drug  I have asked her not to take again  I would recommend we try to use aztreonam 2gams IV q 8 starting on Monday  Will loop in [employer] who has seen in clinic  ? Pt may need first dose of aztreonam int he home?

## 2020-11-10 ENCOUNTER — Other Ambulatory Visit: Payer: Self-pay

## 2020-11-10 ENCOUNTER — Emergency Department (HOSPITAL_BASED_OUTPATIENT_CLINIC_OR_DEPARTMENT_OTHER): Payer: PRIVATE HEALTH INSURANCE

## 2020-11-10 ENCOUNTER — Emergency Department (HOSPITAL_BASED_OUTPATIENT_CLINIC_OR_DEPARTMENT_OTHER)
Admission: EM | Admit: 2020-11-10 | Discharge: 2020-11-11 | Disposition: A | Discharge status: Home / Self Care | Payer: PRIVATE HEALTH INSURANCE | Attending: Emergency Medicine | Admitting: Emergency Medicine

## 2020-11-10 ENCOUNTER — Telehealth: Payer: Self-pay

## 2020-11-10 ENCOUNTER — Encounter (HOSPITAL_BASED_OUTPATIENT_CLINIC_OR_DEPARTMENT_OTHER): Payer: Self-pay

## 2020-11-10 DIAGNOSIS — J189 Pneumonia, unspecified organism: Secondary | ICD-10-CM | POA: Diagnosis not present

## 2020-11-10 DIAGNOSIS — J45909 Unspecified asthma, uncomplicated: Secondary | ICD-10-CM | POA: Insufficient documentation

## 2020-11-10 DIAGNOSIS — M79601 Pain in right arm: Secondary | ICD-10-CM | POA: Diagnosis not present

## 2020-11-10 DIAGNOSIS — T82598A Other mechanical complication of other cardiac and vascular devices and implants, initial encounter: Secondary | ICD-10-CM | POA: Diagnosis not present

## 2020-11-10 DIAGNOSIS — Y829 Unspecified medical devices associated with adverse incidents: Secondary | ICD-10-CM | POA: Insufficient documentation

## 2020-11-10 LAB — CBC WITH DIFFERENTIAL/PLATELET
Abs Immature Granulocytes: 0.04 10*3/uL (ref 0.00–0.07)
Basophils Absolute: 0.1 10*3/uL (ref 0.0–0.1)
Basophils Relative: 1 %
Eosinophils Absolute: 0.4 10*3/uL (ref 0.0–0.5)
Eosinophils Relative: 5 %
HCT: 40.5 % (ref 36.0–46.0)
Hemoglobin: 13.6 g/dL (ref 12.0–15.0)
Immature Granulocytes: 0 %
Lymphocytes Relative: 41 %
Lymphs Abs: 3.9 10*3/uL (ref 0.7–4.0)
MCH: 26.7 pg (ref 26.0–34.0)
MCHC: 33.6 g/dL (ref 30.0–36.0)
MCV: 79.4 fL — ABNORMAL LOW (ref 80.0–100.0)
Monocytes Absolute: 0.6 10*3/uL (ref 0.1–1.0)
Monocytes Relative: 6 %
Neutro Abs: 4.6 10*3/uL (ref 1.7–7.7)
Neutrophils Relative %: 47 %
Platelets: 267 10*3/uL (ref 150–400)
RBC: 5.1 MIL/uL (ref 3.87–5.11)
RDW: 13.4 % (ref 11.5–15.5)
WBC: 9.7 10*3/uL (ref 4.0–10.5)
nRBC: 0 % (ref 0.0–0.2)

## 2020-11-10 LAB — COMPREHENSIVE METABOLIC PANEL
ALT: 13 U/L (ref 0–44)
AST: 17 U/L (ref 15–41)
Albumin: 3.8 g/dL (ref 3.5–5.0)
Alkaline Phosphatase: 54 U/L (ref 38–126)
Anion gap: 10 (ref 5–15)
BUN: 11 mg/dL (ref 6–20)
CO2: 28 mmol/L (ref 22–32)
Calcium: 9.3 mg/dL (ref 8.9–10.3)
Chloride: 97 mmol/L — ABNORMAL LOW (ref 98–111)
Creatinine, Ser: 0.59 mg/dL (ref 0.44–1.00)
GFR, Estimated: >60 mL/min (ref >60)
Glucose, Bld: 93 mg/dL (ref 70–99)
Potassium: 4.1 mmol/L (ref 3.5–5.1)
Sodium: 135 mmol/L (ref 135–145)
Total Bilirubin: 0.6 mg/dL (ref 0.3–1.2)
Total Protein: 8.9 g/dL — ABNORMAL HIGH (ref 6.5–8.1)

## 2020-11-10 MED ORDER — IOHEXOL 350 MG/ML SOLN
100.0000 mL | Freq: Once | INTRAVENOUS | Status: AC | PRN
  Administered 2020-11-10: 100 mL via INTRAVENOUS

## 2020-11-10 MED ORDER — HYDROCODONE-ACETAMINOPHEN 5-325 MG PO TABS
1.0000 | ORAL_TABLET | Freq: Once | ORAL | Status: AC
  Administered 2020-11-11: 1 via ORAL
  Filled 2020-11-10: qty 1

## 2020-11-10 MED ORDER — HYDROCODONE-ACETAMINOPHEN 5-325 MG PO TABS: 1.0000 | ORAL_TABLET | ORAL | 0 refills | Status: DC | PRN

## 2020-11-10 NOTE — ED Notes (Signed)
Pt states pain in right arm radiating to shoulder that comes and goes due to PICC line. Just started today.

## 2020-11-10 NOTE — Discharge Instructions (Addendum)
Return if any problems. Call Dr. Luciana Axe to discuss symptoms.  If symptoms persist he may want to have Radiology place a different line

## 2020-11-10 NOTE — Telephone Encounter (Signed)
Excellent. She also mentioned wanting to possibly see a provider int he clinic which I forgot to put in the note from weekend. There might be some slots if she really needs to be seen

## 2020-11-10 NOTE — Telephone Encounter (Signed)
Thanks [employer].  Yes, aztreonam or meropenem 2 grams three times a day to finish the 14 days.

## 2020-11-10 NOTE — Telephone Encounter (Signed)
Sorry, yes, meropenem 1 gram every 8 hours not 2 grams.

## 2020-11-10 NOTE — Telephone Encounter (Signed)
Spoke with Corrie Dandy at [facility] to relay verbal orders per Dr. Luciana Axe to change medication to meropenem 1 gram IV every 8 hours. Orders repeated and verified.   Per Tim, they should be able to get the medication out to the patient today, [place] to do first dose in home.   Sandie Ano, RN

## 2020-11-10 NOTE — ED Provider Notes (Signed)
[facility] EMERGENCY DEPARTMENT Provider Note   CSN: [id number] Arrival date & time: 11/10/20  1715     History Chief Complaint  Patient presents with  . Vascular Access Problem    PICC  . Arm Pain    Erica Khan is a 39 y.o. female.  The history is provided by the patient. No language interpreter was used.  Arm Pain This is a new problem. The current episode started yesterday. The problem occurs constantly. The problem has been gradually worsening. Nothing aggravates the symptoms. Nothing relieves the symptoms. She has tried nothing for the symptoms. The treatment provided no relief.  Pt complains of pain in her right arm where pick line was placed and in her neck and scapula.  Pt reports pain started yesterday and has increased today.  Pt reports she had a pneumonia as a child and has a nonfunctioning right lung.  Pt recently diagnosed with Pseudomonas and had a PICC line placed one week ago.  Pt was receiving cefepime until Saturday when she broke out in a rash.  Pt is suppose to start aztrenam but home health has not started yet.     Past Medical History:  Diagnosis Date  . Asthma   . Bronchitis     Patient Active Problem List   Diagnosis Date Noted  . PICC (peripherally inserted central catheter) in place 11/05/2020  . Bronchiectasis (HCC) 10/15/2020  . Pseudomonas aeruginosa infection 10/15/2020    Past Surgical History:  Procedure Laterality Date  . CESAREAN SECTION  2011     OB History   No obstetric history on file.     Family History  Problem Relation Age of Onset  . Osteoarthritis Mother   . Cancer Neg Hx     Social History   Tobacco Use  . Smoking status: Never Smoker  . Smokeless tobacco: Never Used  Vaping Use  . Vaping Use: Never used  Substance Use Topics  . Alcohol use: Yes    Comment: occ  . Drug use: Never    Home Medications Prior to Admission medications   Medication Sig Start Date End Date Taking? Authorizing  Provider  albuterol (VENTOLIN HFA) 108 (90 Base) MCG/ACT inhaler Inhale 2 puffs into the lungs every 6 (six) hours as needed. 07/11/20   Tomma Lightning, MD  budesonide-formoterol (SYMBICORT) 160-4.5 MCG/ACT inhaler Inhale 2 puffs into the lungs 2 (two) times daily. 07/11/20   Virl Diamond A, MD  sodium chloride 0.9 % nebulizer solution Take 3 mLs by nebulization 2 (two) times daily as needed for wheezing. 07/11/20   Virl Diamond A, MD  Tobramycin 28 MG CAPS Place 112 mg into inhaler and inhale in the morning and at bedtime. 10/15/20   Kuppelweiser, Cassie L, RPH-CPP    Allergies    Cefepime  Review of Systems   Review of Systems  All other systems reviewed and are negative.   Physical Exam Updated Vital Signs BP 117/80 (BP Location: Right Arm)   Pulse (!) 51   Temp 97.6 F (36.4 C) (Oral)   Resp 16   Ht 5\' 3"  (1.6 m)   Wt 54.4 kg   LMP 10/19/2020   SpO2 100%   BMI 21.26 kg/m   Physical Exam Vitals and nursing note reviewed.  Constitutional:      Appearance: She is well-developed and well-nourished.  HENT:     Head: Normocephalic.     Mouth/Throat:     Mouth: Mucous membranes are moist.  Eyes:  Extraocular Movements: EOM normal.     Pupils: Pupils are equal, round, and reactive to light.  Cardiovascular:     Rate and Rhythm: Normal rate and regular rhythm.  Pulmonary:     Effort: Pulmonary effort is normal.     Comments: No lung sounds right  Abdominal:     General: There is no distension.  Musculoskeletal:        General: Normal range of motion.     Cervical back: Normal range of motion.  Neurological:     General: No focal deficit present.     Mental Status: She is alert and oriented to person, place, and time.  Psychiatric:        Mood and Affect: Mood and affect and mood normal.     ED Results / Procedures / Treatments   Labs (all labs ordered are listed, but only abnormal results are displayed) Labs Reviewed  CBC WITH DIFFERENTIAL/PLATELET   COMPREHENSIVE METABOLIC PANEL    EKG None  Radiology DG Chest 1 View  Result Date: 11/10/2020 CLINICAL DATA:  PICC line problem. EXAM: CHEST  1 VIEW COMPARISON:  None. FINDINGS: There is a right-sided PICC line in place with the tip terminating near the expected location of the cavoatrial junction. There is a markedly abnormal appearance of the right lung field as before with air and fluid-filled cysts noted. There is shift of the mediastinum to the right. The left lung field is mostly clear. There is no acute osseous abnormality. IMPRESSION: 1. Right-sided PICC line terminates near the expected location of the cavoatrial junction. 2. Otherwise, no significant interval change. Electronically Signed   By: Katherine Mantle M.D.   On: 11/10/2020 17:59    Procedures Procedures (including critical care time)  Medications Ordered in ED Medications - No data to display  ED Course  I have reviewed the triage vital signs and the nursing notes.  Pertinent labs & imaging results that were available during my care of the patient were reviewed by me and considered in my medical decision making (see chart for details).    MDM Rules/Calculators/A&P                          MDM:  Pt reports relief with pain medications. Ct scan no pe. PICC line in place,  I spoke to Dr. Chilton Si Radiologist.  He advised if pt has continued discomfort she should contact her MD to schedule to have a replacement line.   Final Clinical Impression(s) / ED Diagnoses Final diagnoses:  Right arm pain  Arm pain, right    Rx / DC Orders ED Discharge Orders    None    An After Visit Summary was printed and given to the patient.    Elson Areas, [place] 11/11/20 1343    Milagros Loll, MD 11/13/20 [phone]

## 2020-11-10 NOTE — Telephone Encounter (Signed)
Received call from Amy at [facility], she states patient is reporting pain in her right arm near the PICC site that is a 7/10 and difficulty breathing. Amy advised her to go to the emergency department.   RN called patient, she states her right arm is hurting and extending up to her neck. She complains of pain when she breathes on the right side in her upper lungs up towards her shoulder. She states it's not so much difficulty breathing as it is pain when breathing. She says it started about 2 hours ago and has been intensifying. RN advised her to seek evaluation at the emergency department. Patient verbalized understanding and states she will have her friend drive her to [facility] ED in the next 20 minutes.   Sandie Ano, RN

## 2020-11-10 NOTE — ED Triage Notes (Signed)
Pt arrives stating she has Psuedomonas and has a PICC line which was inserted 7 days ago. Was receiving antibiotics but she was getting a rash so they told her to stop. Since last night she is now having pain to PICC line site, tricep and up in right shoulder blade up to right neck. Worsened over past 2 hours.

## 2020-11-10 NOTE — Telephone Encounter (Signed)
Please see pt email to ID on 12.18.21. Will close encounter.

## 2020-11-10 NOTE — Telephone Encounter (Signed)
Received call from [facility] at [facility], she states they won't have the aztreonam until tomorrow afternoon, she was wondering if it was okay to switch to the meropenem. RN will route to provider to confirm dose.   Sandie Ano, RN

## 2020-11-10 NOTE — Telephone Encounter (Signed)
Spoke with Tim at [facility] to relay verbal orders per Dr. Luciana Axe to stop the cefepime and start aztreonam 2 grams IV every 8 hours to complete the patient's 14 days of treatment. Orders repeated and verified. Per Tim, patient has [facility] who will complete first dose.   Spoke with patient to advise her to not take any more cefepime, let her know that home care should be out to start her new medication hopefully today. Patient verbalized understanding and has no further questions.    Sandie Ano, RN

## 2020-11-27 LAB — ACID-FAST (MYCOBACTERIA) SMEAR AND CULTURE WITH REFLEX TO IDENTIFICATION
Acid Fast Culture: NEGATIVE
Acid Fast Smear: NEGATIVE

## 2020-12-17 ENCOUNTER — Encounter: Payer: Self-pay | Admitting: Internal Medicine

## 2020-12-17 ENCOUNTER — Other Ambulatory Visit: Payer: Self-pay

## 2020-12-17 ENCOUNTER — Ambulatory Visit (INDEPENDENT_AMBULATORY_CARE_PROVIDER_SITE_OTHER): Payer: No Typology Code available for payment source | Admitting: Internal Medicine

## 2020-12-17 VITALS — BP 118/84 | HR 63 | Temp 97.7°F | Wt 121.6 lb

## 2020-12-17 DIAGNOSIS — R0600 Dyspnea, unspecified: Secondary | ICD-10-CM | POA: Diagnosis not present

## 2020-12-17 DIAGNOSIS — A498 Other bacterial infections of unspecified site: Secondary | ICD-10-CM | POA: Diagnosis not present

## 2020-12-17 DIAGNOSIS — J479 Bronchiectasis, uncomplicated: Secondary | ICD-10-CM | POA: Diagnosis not present

## 2020-12-17 DIAGNOSIS — Z452 Encounter for adjustment and management of vascular access device: Secondary | ICD-10-CM | POA: Diagnosis not present

## 2020-12-17 DIAGNOSIS — R0609 Other forms of dyspnea: Secondary | ICD-10-CM

## 2020-12-17 NOTE — Assessment & Plan Note (Signed)
She continues to follow with pulmonary.

## 2020-12-17 NOTE — Progress Notes (Signed)
   Subjective:    Patient ID: Erica Khan, female    DOB: 1981/10/27, 40 y.o.   MRN: [id number]  HPI Here for follow up of Pseudomonas infection She started initially on tobramycin inhaled and after 2 weeks had felt markedly improved with her cough, congestion and other symptoms.  I put her on IV cefepime as well for 2 weeks and she completed 4 weeks of the inhaled tobi.  She had some issues with rash and was changed to meropenem and finished the course.  She is here for follow up.   She continues to feel much better compared to last winter.  She does have some sob and DOE but overall better.  Much less cough.  She had a CT scan in December in the ED and much improved cavitary area.   She has noted being much more hungry recently which has been very unusual for her.  No weight loss or gain.  She has been getting on the treadmill at the gym but gets quite dyspneic.     Review of Systems     Objective:   Physical Exam Constitutional:      Appearance: Normal appearance.  Eyes:     General: No scleral icterus. Cardiovascular:     Rate and Rhythm: Normal rate and regular rhythm.  Pulmonary:     Effort: Pulmonary effort is normal. No respiratory distress.     Breath sounds: Normal breath sounds. No wheezing, rhonchi or rales.  Neurological:     General: No focal deficit present.     Mental Status: She is alert.  Psychiatric:        Mood and Affect: Mood normal.   SH; no tobacco        Assessment & Plan:

## 2020-12-17 NOTE — Assessment & Plan Note (Signed)
This has been removed.  No further issues.

## 2020-12-17 NOTE — Assessment & Plan Note (Signed)
clinically she is doing well and appears cleared.  No indication at this time for further treatment though will consider if she develops significant exacerbation again in the future, at least with inhaled tobramycin.   She will return as needed

## 2020-12-17 NOTE — Assessment & Plan Note (Signed)
I querry if pulmonary rehab would be of benefit for her now that the Pseudomonas has been treated.

## 2021-01-13 ENCOUNTER — Ambulatory Visit (INDEPENDENT_AMBULATORY_CARE_PROVIDER_SITE_OTHER): Payer: No Typology Code available for payment source | Admitting: Pulmonary Disease

## 2021-01-13 ENCOUNTER — Other Ambulatory Visit: Payer: Self-pay

## 2021-01-13 ENCOUNTER — Encounter: Payer: Self-pay | Admitting: Pulmonary Disease

## 2021-01-13 VITALS — BP 118/70 | HR 75 | Temp 98.0°F | Ht 63.0 in | Wt 122.8 lb

## 2021-01-13 DIAGNOSIS — J479 Bronchiectasis, uncomplicated: Secondary | ICD-10-CM | POA: Diagnosis not present

## 2021-01-13 MED ORDER — BREZTRI AEROSPHERE 160-9-4.8 MCG/ACT IN AERO: 2.0000 | INHALATION_SPRAY | Freq: Two times a day (BID) | RESPIRATORY_TRACT | 0 refills | Status: DC

## 2021-01-13 NOTE — Addendum Note (Signed)
Addended by: Charlott Holler on: 01/13/2021 11:54 AM   Modules accepted: Orders

## 2021-01-13 NOTE — Patient Instructions (Signed)
Try sample of breztri -2 puffs twice daily -In place of Symbicort  -Call us to let us know if it helps her symptoms better and we can send in a prescription, otherwise if you feel it is not better than Symbicort and we can stay on Symbicort  Call with any significant concerns  I will see you back in 3 months  If you start having increasing sputum/cough -Get back to using the flutter device and saline nebulization to help with clearing sputum  -If there is concerning amounts of sputum, we need to get another sample to the lab to check

## 2021-01-13 NOTE — Progress Notes (Signed)
Erica Khan    [id number]    1981/11/17  Primary Care Physician:Worley, Lelon Mast, PA  Referring Physician: Jarold Motto, PA [address] [place],  [place] [address]  Chief complaint:   Follow-up for bronchiectasis  HPI: Completed course of treatment with tobramycin and cefepime-switched over to meropenem She felt better following treatment  She has been eating a little bit more Just a couple of pounds weight gain  She does have some chest tightness Shortness of breath whenever she is exerting herself significantly She continues to stay very active Occasional cough No significant sputum production No fever or chills  She has not been using the flutter device or hypertonic saline nebulizations  She last followed up with Dr.Comer, In January Continues to feel better overall following treatment for Pseudomonas bronchiectasis  Historically Chronic lung infection started at age 49 with bronchiectasis right lung Recent CT did show some involvement of the left  Uses nebulization treatments-uses hypertonic saline, has not been religious with its use Uses Mucinex intermittently  Since the cold weather she has had more symptoms, no fevers, no chills Not feeling acutely ill  She does have wheezing-Symbicort helps  She does have a regular cough, increased sputum the last few days  Has shortness of breath with wheezing Occasional throat discomfort  Was evaluated about 2018 in [place] with a chest x-ray and CT scan which I reviewed with the patient showing extensive bronchiectatic changes with completely destroyed right lung  She has had bronchoscopies at least twice in [place], does not recollect any organism being cultured  Current culture did reveal Pseudomonas  Outpatient Encounter Medications as of 01/13/2021  Medication Sig  . albuterol (VENTOLIN HFA) 108 (90 Base) MCG/ACT inhaler Inhale 2 puffs into the lungs every 6 (six) hours as needed.  .  budesonide-formoterol (SYMBICORT) 160-4.5 MCG/ACT inhaler Inhale 2 puffs into the lungs 2 (two) times daily.  Marland Kitchen HYDROcodone-acetaminophen (NORCO/VICODIN) 5-325 MG tablet Take 1 tablet by mouth every 4 (four) hours as needed for moderate pain. (Patient not taking: Reported on 01/13/2021)  . sodium chloride 0.9 % nebulizer solution Take 3 mLs by nebulization 2 (two) times daily as needed for wheezing. (Patient not taking: Reported on 01/13/2021)  . Tobramycin 28 MG CAPS Place 112 mg into inhaler and inhale in the morning and at bedtime. (Patient not taking: Reported on 01/13/2021)   No facility-administered encounter medications on file as of 01/13/2021.    Allergies as of 01/13/2021 - Review Complete 01/13/2021  Allergen Reaction Noted  . Cefepime Other (See Comments) 11/08/2020    Past Medical History:  Diagnosis Date  . Asthma   . Bronchitis     Past Surgical History:  Procedure Laterality Date  . CESAREAN SECTION  2011    Family History  Problem Relation Age of Onset  . Osteoarthritis Mother   . Cancer Neg Hx     Social History   Socioeconomic History  . Marital status: Single    Spouse name: Not on file  . Number of children: Not on file  . Years of education: Not on file  . Highest education level: Not on file  Occupational History  . Not on file  Tobacco Use  . Smoking status: Never Smoker  . Smokeless tobacco: Never Used  Vaping Use  . Vaping Use: Never used  Substance and Sexual Activity  . Alcohol use: Yes    Comment: occ  . Drug use: Never  . Sexual activity: Yes  Birth control/protection: Condom  Other Topics Concern  . Not on file  Social History Narrative   From [place]   Currently at [employer] to [place] at end of 2020   Social Determinants of Corporate investment banker Strain: Not on [employer] Insecurity: Not on file  Transportation Needs: Not on file  Physical Activity: Not on file  Stress: Not on file  Social Connections: Not on file   Intimate Partner Violence: Not on file    Review of Systems  Constitutional: Negative for fever.  Respiratory: Positive for cough and shortness of breath.   Cardiovascular: Negative.     Vitals:   01/13/21 1107  BP: 118/70  Pulse: 75  Temp: 98 F (36.7 C)  SpO2: 97%     Physical Exam Constitutional:      Appearance: Normal appearance.  HENT:     Nose: No congestion or rhinorrhea.     Mouth/Throat:     Pharynx: No oropharyngeal exudate or posterior oropharyngeal erythema.  Cardiovascular:     Rate and Rhythm: Normal rate and regular rhythm.     Pulses: Normal pulses.     Heart sounds: Normal heart sounds. No murmur heard. No friction rub.  Pulmonary:     Effort: Pulmonary effort is normal. No respiratory distress.     Breath sounds: Normal breath sounds. No stridor. No wheezing or rhonchi.  Musculoskeletal:     Cervical back: No rigidity or tenderness.  Neurological:     Mental Status: She is alert.    Data Reviewed:  Patient brought CT scans from 2018 which was reviewed with the patient Chest x-ray is reviewed with the patient   2021 shows improvement in endobronchial collection  Assessment:  Bronchiectasis -She is better clinically -Treated with antibiotics-inhaled tobramycin and cefepime/meropenem  Pseudomonas bronchiectasis -Completed treatment -Not bringing up any significant secretions at present  History of asthma .  Continue Symbicort .  With shortness of breath and chest tightness -We will try a sample of Breztri to see if this helps better than Symbicort .  Albuterol as needed  For mucus clearance -Nebulization with hypertonic saline -Flutter device use   Plan/Recommendations:  .  We will give her a sample of Breztri to try in place of Symbicort .  To go back to using hypertonic saline nebulization and flutter device to clear secretions as needed .  Obtain a pulse oximeter to check oxygen saturations when she is active to see whether  she is desaturating .  Encouraged to give [place] a call if we need to send another sputum sample to see if she is having recurrence of infection .  Call with any other significant concerns Follow-up in 3 months  Virl Diamond MD [facility] 01/13/2021, 11:13 AM  CC: Jarold Motto, PA

## 2021-01-20 ENCOUNTER — Telehealth: Payer: Self-pay | Admitting: Pulmonary Disease

## 2021-01-20 DIAGNOSIS — J479 Bronchiectasis, uncomplicated: Secondary | ICD-10-CM

## 2021-01-20 MED ORDER — BUDESONIDE-FORMOTEROL FUMARATE 160-4.5 MCG/ACT IN AERO: 2.0000 | INHALATION_SPRAY | Freq: Two times a day (BID) | RESPIRATORY_TRACT | 11 refills | Status: DC

## 2021-01-20 NOTE — Telephone Encounter (Signed)
01/20/21  Called and spoke with patient.  Sent in refills of Symbicort 160.  During conversation patient mentioned that patient needs generic of Symbicort 160 which is an out-of-pocket cost of around $204 a month.  Branded Symbicort is around $300 a month.  I explained to patient that would be in her best interest to apply for the [place] for [place] pharmaceutical assistance program.  [facility] her through over the phone how to complete this.  She will print this out completed and drop it off at our office with attention to Lauren.  Another option potentially in the future if she is denied for the [place] pharmaceutical assistance is considering switching her to AirDuo RespiClick.  And using a good Rx coupon.  This would be around $33 a month at a [facility] or a [facility].  I reviewed this with the patient as well.  She is aware.  We will route to Dr. Val Eagle and Leotis Shames is FYI.  Elisha Headland, FNP

## 2021-01-26 ENCOUNTER — Telehealth: Payer: Self-pay | Admitting: Pulmonary Disease

## 2021-01-26 DIAGNOSIS — J471 Bronchiectasis with (acute) exacerbation: Secondary | ICD-10-CM

## 2021-01-26 MED ORDER — DOXYCYCLINE HYCLATE 100 MG PO TABS: 100.0000 mg | ORAL_TABLET | Freq: Two times a day (BID) | ORAL | 0 refills | Status: DC

## 2021-01-26 NOTE — Telephone Encounter (Signed)
Needs PCR testing for covid, looks like she got J/J last spring so she is at risk. Needs sputum culture (I ordered). [facility], she should start after sputum culture if able. Tylenol is preferred for fever. Stay well hydrated. Use hypertonic saline and flutter valve as prescribed. Cont symb. Follow-up if not better or symptoms worsen. Would get covid booster when better.   Cc: olalere

## 2021-01-26 NOTE — Telephone Encounter (Signed)
Primary Pulmonologist: TP Last office visit and with whom:  01/13/21 with TP What do we see them for (pulmonary problems): bronchiectasis Last OV assessment/plan: Plan/Recommendations:  .  We will give her a sample of Breztri to try in place of Symbicort .  To go back to using hypertonic saline nebulization and flutter device to clear secretions as needed .  Obtain a pulse oximeter to check oxygen saturations when she is active to see whether she is desaturating .  Encouraged to give [place] a call if we need to send another sputum sample to see if she is having recurrence of infection .  Call with any other significant concerns Follow-up in 3 months  Virl Diamond MD [facility] 01/13/2021, 11:13 AM  CC: Jarold Motto, PA  Was appointment offered to patient (explain)?  Patient wanted recommendations first.    Reason for call: Spoke with patient. She stated that she has been having increased SOB for the past 3 days. She has also been running a fever. She checked her temp yesterday and it was 101.41F. She has not checked her temp yet today since she has been taking ibuprofen around the clock. She has noticed some increased chest tightness in the middle of her chest. She has a productive cough with thick, clear phlegm. She has been using her  Symbicort twice daily.   She denied being around anyone sick recently. She took an at home covid test yesterday and it was negative.   (examples of things to ask: : When did symptoms start? Fever? Cough? Productive? Color to sputum? More sputum than usual? Wheezing? Have you needed increased oxygen? Are you taking your respiratory medications? What over the counter measures have you tried?)  Allergies  Allergen Reactions  . Cefepime Other (See Comments)    Diffuse rash and wheezing    Immunization History  Administered Date(s) Administered  . Linwood Dibbles (J&J) SARS-COV-2 Vaccination 01/30/2020   Pharmacy is [facility] in [place].   Beth, can you please advise since AO is not available today. Thanks!

## 2021-01-26 NOTE — Telephone Encounter (Signed)
Called and spoke with pt letting her know the info stated by [facility] and she verbalized understanding. Stated to her that I was going to put sputum cup up front at office and if she could send someone to office to get it. Pt stated she would send someone to get it. Also stated to her that we need her to have covid PCR test done even though she did the home test and she verbalized understanding. Stated to do sputum sample prior to beginning abx if able and she verbalized understanding. Nothing further needed.

## 2021-01-26 NOTE — Telephone Encounter (Signed)
See message.

## 2021-01-27 ENCOUNTER — Other Ambulatory Visit: Payer: No Typology Code available for payment source

## 2021-01-27 ENCOUNTER — Telehealth (INDEPENDENT_AMBULATORY_CARE_PROVIDER_SITE_OTHER): Payer: No Typology Code available for payment source | Admitting: Family Medicine

## 2021-01-27 DIAGNOSIS — R5383 Other fatigue: Secondary | ICD-10-CM | POA: Diagnosis not present

## 2021-01-27 DIAGNOSIS — R059 Cough, unspecified: Secondary | ICD-10-CM

## 2021-01-27 DIAGNOSIS — R0602 Shortness of breath: Secondary | ICD-10-CM | POA: Diagnosis not present

## 2021-01-27 DIAGNOSIS — R509 Fever, unspecified: Secondary | ICD-10-CM | POA: Diagnosis not present

## 2021-01-27 DIAGNOSIS — J471 Bronchiectasis with (acute) exacerbation: Secondary | ICD-10-CM

## 2021-01-27 NOTE — Progress Notes (Signed)
Virtual Visit via Video Note  I connected with Darleen  on 01/27/21 at  5:40 PM EST by a video enabled telemedicine application and verified that I am speaking with the correct person using two identifiers.  Location patient: home, [place] Location provider:work or home office Persons participating in the virtual visit: patient, provider, patient's friend  I discussed the limitations of evaluation and management by telemedicine and the availability of in person appointments. The patient expressed understanding and agreed to proceed.   HPI:  Acute telemedicine visit for "covid symptoms": -Onset:  a week ago -Symptoms include: headache, body aches, chills, nasal congestion, cough, fevers today and yesterday and now feeling rapidly worse the last 24 hours "very weak", SOB worsening and significant difficulty breathing even at rest, headache worsening, fever higher over 103 today   -home covid test was negative initially -has been talking with her pulmonology office and has flu, covid and sputum testing pending and doxy to take from pulm, but has rapidly worsened today -Pertinent past medical history: bronchiectasis, asthma, pseudomonas  -Pertinent medication allergies:codeine -COVID-19 vaccine status: fully vaccinated and boosted  ROS: See pertinent positives and negatives per HPI.  Past Medical History:  Diagnosis Date  . Asthma   . Bronchitis     Past Surgical History:  Procedure Laterality Date  . CESAREAN SECTION  2011     Current Outpatient Medications:  .  albuterol (VENTOLIN HFA) 108 (90 Base) MCG/ACT inhaler, Inhale 2 puffs into the lungs every 6 (six) hours as needed., Disp: 18 g, Rfl: 5 .  budesonide-formoterol (SYMBICORT) 160-4.5 MCG/ACT inhaler, Inhale 2 puffs into the lungs 2 (two) times daily., Disp: 1 each, Rfl: 11 .  doxycycline (VIBRA-TABS) 100 MG tablet, Take 1 tablet (100 mg total) by mouth 2 (two) times daily., Disp: 20 tablet, Rfl: 0 .  HYDROcodone-acetaminophen  (NORCO/VICODIN) 5-325 MG tablet, Take 1 tablet by mouth every 4 (four) hours as needed for moderate pain. (Patient not taking: Reported on 01/13/2021), Disp: 10 tablet, Rfl: 0 .  sodium chloride 0.9 % nebulizer solution, Take 3 mLs by nebulization 2 (two) times daily as needed for wheezing. (Patient not taking: Reported on 01/13/2021), Disp: 90 mL, Rfl: 11 .  Tobramycin 28 MG CAPS, Place 112 mg into inhaler and inhale in the morning and at bedtime. (Patient not taking: Reported on 01/13/2021), Disp: 224 capsule, Rfl: 0  EXAM:  VITALS per patient if applicable: O2 in the 92-94 range on pulse ox, HR ~ 107, fever over 103 today per patient  GENERAL: alert, appears unwell and tired  HEENT: atraumatic, conjunttiva clear, no obvious abnormalities on inspection of external nose and ears  NECK: normal movements of the head and neck  LUNGS: appears to have difficulty breathing even just talking  CV: no obvious cyanosis  MS: moves all visible extremities without noticeable abnormality  PSYCH/NEURO: pleasant and cooperative, no obvious depression or anxiety, speech and thought processing grossly intact  ASSESSMENT AND PLAN:  Discussed the following assessment and plan:  SOB (shortness of breath)  Lethargy  Cough  Fever, unspecified fever cause  -we discussed possible serious and likely etiologies, options for evaluation and workup, limitations of telemedicine visit vs in person visit, treatment, treatment risks and precautions. Given rapidly worsening symptoms with difficulty breathing and fevers escalating after 1 week, advised prompt in person evaluation right away this evening.  Initially she was reluctant to seek in person care.  Finally convinced her and the friend who is with her to take her for  in person evaluation this evening after lengthy discussion of potential etiologies, work-up and treatment.  Discussed options for in person care this evening.  They agreed to go this evening.  Given  her symptoms, I advised emergency room.  However she initially declined this option.  We did talk about urgent care options as well.  Declined transportation.  I discussed the assessment and treatment plan with the patient. The patient was provided an opportunity to ask questions and all were answered. The patient agreed with the plan and demonstrated an understanding of the instructions.     Terressa Koyanagi, DO

## 2021-01-27 NOTE — Patient Instructions (Addendum)
Please seek in person care right away promptly this evening at the emergency room or the urgent care as we discussed.

## 2021-01-28 ENCOUNTER — Telehealth: Payer: Self-pay | Admitting: Pulmonary Disease

## 2021-01-28 NOTE — Telephone Encounter (Signed)
LMTCB  Will route message to AO to see if he has received any information from Urgent care regarding the patient.   AO please advise if you have received an email regarding the patient urgent care visit. Thanks :)

## 2021-02-03 NOTE — Telephone Encounter (Signed)
There is no way of accessing sputum results until it is fully processed  Appropriate to start and complete antibiotics if you are feeling poorly.  I did not receive any x-rays or report from them.  Just checked my email again.

## 2021-02-03 NOTE — Telephone Encounter (Signed)
Call made to [facility], spoke with Marcelino Duster. This lab is still in preliminary status and has not resulted/completed yet. Per michelle since covid these tests results in 43-56 days.

## 2021-02-10 ENCOUNTER — Encounter: Payer: Self-pay | Admitting: Pulmonary Disease

## 2021-02-10 ENCOUNTER — Other Ambulatory Visit: Payer: Self-pay

## 2021-02-10 ENCOUNTER — Ambulatory Visit (INDEPENDENT_AMBULATORY_CARE_PROVIDER_SITE_OTHER): Payer: No Typology Code available for payment source | Admitting: Pulmonary Disease

## 2021-02-10 DIAGNOSIS — J479 Bronchiectasis, uncomplicated: Secondary | ICD-10-CM

## 2021-02-10 MED ORDER — TOBRAMYCIN 300 MG/5ML IN NEBU: 300.0000 mg | INHALATION_SOLUTION | Freq: Two times a day (BID) | RESPIRATORY_TRACT | 0 refills | Status: DC

## 2021-02-10 MED ORDER — BUDESONIDE-FORMOTEROL FUMARATE 160-4.5 MCG/ACT IN AERO: 2.0000 | INHALATION_SPRAY | Freq: Two times a day (BID) | RESPIRATORY_TRACT | 11 refills | Status: DC

## 2021-02-10 NOTE — Patient Instructions (Signed)
We will send in prescription for tobramycin to be used for 21 days  Obtain CT scan of the chest without contrast  We will call ID office to get your follow-up appointment  I will follow-up with you in about 4 weeks  Call with significant concerns

## 2021-02-10 NOTE — Progress Notes (Signed)
Erica Khan    [id number]    09-03-1981  Primary Care Physician:Worley, Lelon Mast, PA  Referring Physician: Jarold Motto, PA [address] [place],  [place] [address]  Chief complaint:   Follow-up for bronchiectasis  HPI:  She recently completed a course of treatment with tobramycin and cefepime/meropenem -Significant improvement in symptoms following treatment  Over the last couple of weeks she had an exacerbation with chest tightness, headache, pain, chills, fever of up to 104 She ended up at urgent care where she was treated with a course of doxycycline Symptoms did improve only a little bit She has persistent chest tightness, decreased energy levels Feels fatigued Did not recover back to baseline  She still feels a lot of chest heaviness some soreness  She is not coughing up any secretions at present  She was able to provide some sputum sample at the onset-unfortunately I cannot find a trace of this  She tries to stay active but more limited recently with the exacerbation Occasional cough No significant sputum production Not having fever or chills at present  She has not been using the flutter device or hypertonic saline nebulizations  She last followed up with Dr.Comer, In January  Historically Chronic lung infection started at age 13 with bronchiectasis right lung Recent CT did show some involvement of the left  Uses nebulization treatments-uses hypertonic saline, has not been religious with its use Uses Mucinex intermittently  Since the cold weather she has had more symptoms, no fevers, no chills Not feeling acutely ill  She does have wheezing-Symbicort helps  She does have a regular cough, increased sputum the last few days  Has shortness of breath with wheezing Occasional throat discomfort  Was evaluated about 2018 in [place] with a chest x-ray and CT scan which I reviewed with the patient showing extensive bronchiectatic changes with  completely destroyed right lung  She has had bronchoscopies at least twice in [place], does not recollect any organism being cultured  Current culture did reveal Pseudomonas  Outpatient Encounter Medications as of 02/10/2021  Medication Sig  . albuterol (VENTOLIN HFA) 108 (90 Base) MCG/ACT inhaler Inhale 2 puffs into the lungs every 6 (six) hours as needed.  . doxycycline (VIBRA-TABS) 100 MG tablet Take 1 tablet (100 mg total) by mouth 2 (two) times daily.  Marland Kitchen HYDROcodone-acetaminophen (NORCO/VICODIN) 5-325 MG tablet Take 1 tablet by mouth every 4 (four) hours as needed for moderate pain.  . sodium chloride 0.9 % nebulizer solution Take 3 mLs by nebulization 2 (two) times daily as needed for wheezing.  . Tobramycin 28 MG CAPS Place 112 mg into inhaler and inhale in the morning and at bedtime.  . [DISCONTINUED] budesonide-formoterol (SYMBICORT) 160-4.5 MCG/ACT inhaler Inhale 2 puffs into the lungs 2 (two) times daily.  . budesonide-formoterol (SYMBICORT) 160-4.5 MCG/ACT inhaler Inhale 2 puffs into the lungs 2 (two) times daily.   No facility-administered encounter medications on file as of 02/10/2021.    Allergies as of 02/10/2021 - Review Complete 02/10/2021  Allergen Reaction Noted  . Cefepime Other (See Comments) 11/08/2020    Past Medical History:  Diagnosis Date  . Asthma   . Bronchitis     Past Surgical History:  Procedure Laterality Date  . CESAREAN SECTION  2011    Family History  Problem Relation Age of Onset  . Osteoarthritis Mother   . Cancer Neg Hx     Social History   Socioeconomic History  . Marital status: Single  Spouse name: Not on file  . Number of children: Not on file  . Years of education: Not on file  . Highest education level: Not on file  Occupational History  . Not on file  Tobacco Use  . Smoking status: Never Smoker  . Smokeless tobacco: Never Used  Vaping Use  . Vaping Use: Never used  Substance and Sexual Activity  . Alcohol use: Yes     Comment: occ  . Drug use: Never  . Sexual activity: Yes    Birth control/protection: Condom  Other Topics Concern  . Not on file  Social History Narrative   From [place]   Currently at [employer] to [place] at end of 2020   Social Determinants of Corporate investment banker Strain: Not on [employer] Insecurity: Not on file  Transportation Needs: Not on file  Physical Activity: Not on file  Stress: Not on file  Social Connections: Not on file  Intimate Partner Violence: Not on file    Review of Systems  Constitutional: Positive for fatigue. Negative for fever.  Respiratory: Positive for shortness of breath. Negative for cough.   Cardiovascular: Negative.     Vitals:   02/10/21 1155  BP: 122/74  Pulse: 85  Temp: (!) 97.2 F (36.2 C)  SpO2: 94%     Physical Exam Constitutional:      Appearance: Normal appearance.  HENT:     Nose: No congestion or rhinorrhea.     Mouth/Throat:     Pharynx: No oropharyngeal exudate or posterior oropharyngeal erythema.  Cardiovascular:     Rate and Rhythm: Normal rate and regular rhythm.     Pulses: Normal pulses.     Heart sounds: Normal heart sounds. No murmur heard. No friction rub.  Pulmonary:     Effort: Pulmonary effort is normal. No respiratory distress.     Breath sounds: No stridor. Rhonchi present. No wheezing.     Comments: Bronchial breath sounds at right base Musculoskeletal:     Cervical back: No rigidity or tenderness.  Neurological:     Mental Status: She is alert.  Psychiatric:        Mood and Affect: Mood normal.    Data Reviewed:  2021 shows improvement in endobronchial collection  Previous CT reviewed during this visit Previous lab work reviewed  Assessment:  Bronchiectasis Exacerbation of bronchiectasis -Course of antibiotics recently likely did not completely treat the exacerbation -Did have a resistant Pseudomonas previously  Pseudomonas bronchiectasis -Not bringing up any secretions at  present  History of asthma  .  Continue Symbicort for asthma  For mucus clearance -Nebulization with hypertonic saline -Flutter device use   Plan/Recommendations:  .  She will continue with Symbicort .  Nebulization treatment with tobramycin will be sent into pharmacy-to be used for 21 days .  Follow-up with infectious disease .  Obtain CT scan of the chest without contrast .  Call with significant concerns .  We will follow-up in 4 weeks   Virl Diamond MD [facility] 02/10/2021, 12:22 PM  CC: Jarold Motto, PA

## 2021-02-21 ENCOUNTER — Ambulatory Visit
Admission: RE | Admit: 2021-02-21 | Discharge: 2021-02-21 | Disposition: A | Discharge status: Home / Self Care | Payer: No Typology Code available for payment source | Source: Ambulatory Visit | Attending: Pulmonary Disease | Admitting: Pulmonary Disease

## 2021-02-21 ENCOUNTER — Other Ambulatory Visit: Payer: Self-pay

## 2021-02-21 DIAGNOSIS — J479 Bronchiectasis, uncomplicated: Secondary | ICD-10-CM

## 2021-03-02 ENCOUNTER — Other Ambulatory Visit: Payer: Self-pay

## 2021-03-02 ENCOUNTER — Ambulatory Visit (INDEPENDENT_AMBULATORY_CARE_PROVIDER_SITE_OTHER): Payer: PRIVATE HEALTH INSURANCE | Admitting: Internal Medicine

## 2021-03-02 ENCOUNTER — Encounter: Payer: Self-pay | Admitting: Internal Medicine

## 2021-03-02 ENCOUNTER — Other Ambulatory Visit (HOSPITAL_COMMUNITY): Payer: Self-pay

## 2021-03-02 DIAGNOSIS — R0600 Dyspnea, unspecified: Secondary | ICD-10-CM | POA: Diagnosis not present

## 2021-03-02 DIAGNOSIS — A498 Other bacterial infections of unspecified site: Secondary | ICD-10-CM

## 2021-03-02 DIAGNOSIS — J470 Bronchiectasis with acute lower respiratory infection: Secondary | ICD-10-CM

## 2021-03-02 DIAGNOSIS — R0609 Other forms of dyspnea: Secondary | ICD-10-CM

## 2021-03-02 MED ORDER — TOBRAMYCIN 300 MG/5ML IN NEBU
300.0000 mg | INHALATION_SOLUTION | Freq: Two times a day (BID) | RESPIRATORY_TRACT | 0 refills | Status: DC
  Filled 2021-03-02: qty 210, 21d supply, fill #0

## 2021-03-02 MED ORDER — TOBRAMYCIN 300 MG/5ML IN NEBU
300.0000 mg | INHALATION_SOLUTION | Freq: Two times a day (BID) | RESPIRATORY_TRACT | 0 refills | Status: DC
  Filled 2021-03-02: qty 56, 6d supply, fill #0

## 2021-03-02 MED ORDER — TOBRAMYCIN 300 MG/5ML IN NEBU
300.0000 mg | INHALATION_SOLUTION | Freq: Two times a day (BID) | RESPIRATORY_TRACT | 0 refills | Status: DC
  Filled 2021-03-02: qty 280, 21d supply, fill #0
  Filled 2021-03-06 – 2021-03-11 (×2): qty 280, 28d supply, fill #0

## 2021-03-02 NOTE — Assessment & Plan Note (Signed)
As above, will start her back on the inhaled tobi to decrease the Pseudomonas burden and hopefully reduce any exacerbations.

## 2021-03-02 NOTE — Assessment & Plan Note (Signed)
Still some symptoms and has been exercising less due to her recent exacerbation.  I encouraged starting back up with her cardiovascular exercise.

## 2021-03-02 NOTE — Assessment & Plan Note (Signed)
She has had a recent exacerbation and unclear if pseudomonas was involved but certainly likely.  I would agree with a month of inhaled tobi and will go through our pharmacy for possible assistance.   Otherwise she will return as needed.

## 2021-03-02 NOTE — Progress Notes (Signed)
   Subjective:    Patient ID: Erica Khan, female    DOB: [birth year], 40 y.o.   MRN: [id number]  HPI Here for a recent exacerbation of her bronchiectasis.  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  She though had a recent episode in March of high fever, cough, and sob and still feeling some fatigue and pleurisy.  She was given an oral antibiotic and improved and after seeing Dr. Aldean Ast it was recommended she take another month of inhaled tobi.  She has not yet  Started it due to cost constraints.  She is better that during her exacerbation but not as well as in January.  No current fever or cough.    Review of Systems  Constitutional: Negative for chills and fever.  Respiratory: Negative for cough, shortness of breath and wheezing.   Skin: Negative for rash.       Objective:   Physical Exam Constitutional:      Appearance: Normal appearance.  Cardiovascular:     Rate and Rhythm: Normal rate and regular rhythm.     Heart sounds: No murmur heard.   Pulmonary:     Effort: Pulmonary effort is normal. No respiratory distress.     Breath sounds: No stridor. No wheezing.     Comments: Breath sounds a bit distant but clear Neurological:     Mental Status: She is alert.  Psychiatric:        Mood and Affect: Mood normal.   SH: no tobacco       Assessment & Plan:

## 2021-03-03 ENCOUNTER — Other Ambulatory Visit (HOSPITAL_COMMUNITY): Payer: Self-pay

## 2021-03-06 ENCOUNTER — Other Ambulatory Visit (HOSPITAL_COMMUNITY): Payer: Self-pay

## 2021-03-11 ENCOUNTER — Other Ambulatory Visit (HOSPITAL_COMMUNITY): Payer: Self-pay

## 2021-03-12 ENCOUNTER — Other Ambulatory Visit (HOSPITAL_COMMUNITY): Payer: Self-pay

## 2021-03-13 ENCOUNTER — Other Ambulatory Visit (HOSPITAL_COMMUNITY): Payer: Self-pay

## 2021-03-22 LAB — AFB CULTURE WITH SMEAR (NOT AT ARMC)
Acid Fast Culture: NEGATIVE
Acid Fast Smear: NEGATIVE

## 2021-03-31 ENCOUNTER — Encounter: Payer: Self-pay | Admitting: Pulmonary Disease

## 2021-03-31 ENCOUNTER — Other Ambulatory Visit: Payer: Self-pay

## 2021-03-31 ENCOUNTER — Ambulatory Visit (INDEPENDENT_AMBULATORY_CARE_PROVIDER_SITE_OTHER): Payer: PRIVATE HEALTH INSURANCE | Admitting: Pulmonary Disease

## 2021-03-31 VITALS — BP 118/74 | HR 83 | Temp 98.6°F | Ht 63.0 in | Wt 121.8 lb

## 2021-03-31 DIAGNOSIS — R0600 Dyspnea, unspecified: Secondary | ICD-10-CM

## 2021-03-31 DIAGNOSIS — R0609 Other forms of dyspnea: Secondary | ICD-10-CM

## 2021-03-31 NOTE — Progress Notes (Signed)
Erica Khan    [id number]    1981-10-30  Primary Care Physician:Worley, Lelon Mast, PA  Referring Physician: Jarold Motto, PA [address] [place],  [place] [address]  Chief complaint:   Follow-up for bronchiectasis  HPI: Increase shortness of breath  Has been using the Symbicort on a regular basis Feels she needs to use her rescue inhaler more  Denies any chest pain or chest discomfort Has not had any significant cough Not really bringing up any secretions at present Denies any chest pains or chest discomfort  No fevers or chills   She recently completed a course of treatment with tobramycin and cefepime/meropenem -Significant improvement in symptoms following treatment with tobramycin in the past  She ended up at urgent care where she was treated with a course of doxycycline recently following an exacerbation Was called in a prescription for tobramycin following last visit, started to feel better soon after, so has not used the tobramycin  She is not coughing up any secretions at present  She tries to stay active but more limited recently with the exacerbation Occasional cough No significant sputum production Not having fever or chills at present  She has not been using the flutter device or hypertonic saline nebulizations  She last followed up with Dr.Comer, In January  Historically Chronic lung infection started at age 40 with bronchiectasis right lung Recent CT did show some involvement of the left  Uses nebulization treatments-uses hypertonic saline, has not been religious with its use Uses Mucinex intermittently  Since the cold weather she has had more symptoms, no fevers, no chills Not feeling acutely ill  She does have wheezing-Symbicort helps  She does have a regular cough, increased sputum the last few days  Has shortness of breath with wheezing Occasional throat discomfort  Was evaluated about 2018 in [place] with a chest x-ray  and CT scan which I reviewed with the patient showing extensive bronchiectatic changes with completely destroyed right lung  She has had bronchoscopies at least twice in [place], does not recollect any organism being cultured  Current culture did reveal Pseudomonas  Outpatient Encounter Medications as of 03/31/2021  Medication Sig  . albuterol (VENTOLIN HFA) 108 (90 Base) MCG/ACT inhaler Inhale 2 puffs into the lungs every 6 (six) hours as needed.  . budesonide-formoterol (SYMBICORT) 160-4.5 MCG/ACT inhaler Inhale 2 puffs into the lungs 2 (two) times daily.  Marland Kitchen HYDROcodone-acetaminophen (NORCO/VICODIN) 5-325 MG tablet Take 1 tablet by mouth every 4 (four) hours as needed for moderate pain.  . sodium chloride 0.9 % nebulizer solution Take 3 mLs by nebulization 2 (two) times daily as needed for wheezing.  Marland Kitchen tobramycin, PF, (TOBI) 300 MG/5ML nebulizer solution Inhale 5 mLs (300 mg total) by nebulization in the morning and at bedtime. For 21 days (Patient not taking: Reported on 03/31/2021)   No facility-administered encounter medications on file as of 03/31/2021.    Allergies as of 03/31/2021 - Review Complete 03/31/2021  Allergen Reaction Noted  . Cefepime Other (See Comments) 11/08/2020    Past Medical History:  Diagnosis Date  . Asthma   . Bronchitis     Past Surgical History:  Procedure Laterality Date  . CESAREAN SECTION  2011    Family History  Problem Relation Age of Onset  . Osteoarthritis Mother   . Cancer Neg Hx     Social History   Socioeconomic History  . Marital status: Single    Spouse name: Not on file  .  Number of children: Not on file  . Years of education: Not on file  . Highest education level: Not on file  Occupational History  . Not on file  Tobacco Use  . Smoking status: Never Smoker  . Smokeless tobacco: Never Used  Vaping Use  . Vaping Use: Never used  Substance and Sexual Activity  . Alcohol use: Yes    Comment: occ  . Drug use: Never  .  Sexual activity: Yes    Birth control/protection: Condom  Other Topics Concern  . Not on file  Social History Narrative   From [place]   Currently at [employer] to [place] at end of 2020   Social Determinants of Corporate investment banker Strain: Not on [employer] Insecurity: Not on file  Transportation Needs: Not on file  Physical Activity: Not on file  Stress: Not on file  Social Connections: Not on file  Intimate Partner Violence: Not on file    Review of Systems  Constitutional: Positive for fatigue. Negative for fever.  Respiratory: Positive for shortness of breath. Negative for cough.   Cardiovascular: Negative.     Vitals:   03/31/21 1206  BP: 118/74  Pulse: 83  Temp: 98.6 F (37 C)  SpO2: 98%     Physical Exam Constitutional:      Appearance: Normal appearance.  HENT:     Nose: No congestion or rhinorrhea.     Mouth/Throat:     Pharynx: No oropharyngeal exudate or posterior oropharyngeal erythema.  Cardiovascular:     Rate and Rhythm: Normal rate and regular rhythm.     Pulses: Normal pulses.     Heart sounds: Normal heart sounds. No murmur heard. No friction rub.  Pulmonary:     Effort: Pulmonary effort is normal. No respiratory distress.     Breath sounds: No stridor. Rhonchi present. No wheezing.     Comments: Bronchial breath sounds at right base Musculoskeletal:     Cervical back: No rigidity or tenderness.  Neurological:     Mental Status: She is alert.  Psychiatric:        Mood and Affect: Mood normal.    Data Reviewed:  2021 shows improvement in endobronchial collection  Previous CT reviewed during this visit Previous lab work reviewed  Assessment:  Bronchiectasis  Shortness of breath which she feels is getting worse -She feels limited with activities and she does not feel bronchiectasis is exacerbated at present  -Course of antibiotics recently likely did not completely treat the exacerbation-was treated recently and she is feeling  better -Did have a resistant Pseudomonas previously  Pseudomonas bronchiectasis -Not bringing up any secretions at present  History of asthma  .  Continue Symbicort for asthma  For mucus clearance -Nebulization with hypertonic saline -Flutter device use   Plan/Recommendations: .  Continue Symbicort .  Nebulization treatment with tobramycin should be initiated if she were to start having increased cough, congestion with mucus production/exacerbation of bronchiectasis .  We will order an echocardiogram for further evaluation of shortness of breath  .  If she continues to feel better then we will see her back in about 3 months  Encouraged to call with any significant concerns   Virl Diamond MD [facility] 03/31/2021, 12:26 PM  CC: Jarold Motto, PA

## 2021-03-31 NOTE — Patient Instructions (Signed)
Echocardiogram for worsening shortness of breath  Continue Symbicort twice a day  Albuterol to be used as needed  Keep your tobramycin to be used if there is significant exacerbation of cough, increased secretions, shortness of breath, wheezing  Call with significant concerns  Tentative follow-up in 3 months

## 2021-04-16 ENCOUNTER — Ambulatory Visit (HOSPITAL_COMMUNITY): Payer: PRIVATE HEALTH INSURANCE | Attending: Pulmonary Disease

## 2021-04-23 ENCOUNTER — Ambulatory Visit (HOSPITAL_COMMUNITY): Payer: PRIVATE HEALTH INSURANCE

## 2021-04-30 ENCOUNTER — Ambulatory Visit (HOSPITAL_COMMUNITY)
Admission: RE | Admit: 2021-04-30 | Discharge: 2021-04-30 | Disposition: A | Discharge status: Home / Self Care | Payer: PRIVATE HEALTH INSURANCE | Source: Ambulatory Visit | Attending: Pulmonary Disease | Admitting: Pulmonary Disease

## 2021-04-30 ENCOUNTER — Other Ambulatory Visit: Payer: Self-pay

## 2021-04-30 DIAGNOSIS — R0609 Other forms of dyspnea: Secondary | ICD-10-CM

## 2021-04-30 DIAGNOSIS — R0600 Dyspnea, unspecified: Secondary | ICD-10-CM | POA: Insufficient documentation

## 2021-04-30 NOTE — Progress Notes (Signed)
  Echocardiogram 2D Echocardiogram has been performed.  Tye Savoy 04/30/2021, 9:33 AM

## 2021-05-01 LAB — ECHOCARDIOGRAM COMPLETE
Area-P 1/2: 3.12 cm2
S' Lateral: 3.05 cm

## 2021-08-12 ENCOUNTER — Telehealth: Payer: Self-pay | Admitting: Pulmonary Disease

## 2021-08-12 NOTE — Telephone Encounter (Signed)
Profitt, Darleen Crocker  [facility] Triage Pool 12 minutes ago (5:28 PM)   Patient sent this message through [facility] [place]. I did schedule her an appointment with [facility] tomorrow, 9/22 at 4pm. Patient has not had a COVID test since having these symptoms. Patient is unaware of any COVID exposure, but she was at a job fair recently.   Please advise if patient needs to get a COVID test before she can come into the office.         Lou Cal  Patient Appointment Schedule Request Pool 2 days ago   NM Appointment Request From: Lou Cal   With Provider: Tomma Lightning, MD [[facility]]   Preferred Date Range: 08/10/2021 - 08/15/2021   Preferred Times: Any Time   Reason for visit: Office Visit   Comments: Breathing issues, chest tightness, running nose, throat rashes and pain     Called and spoke with patient to talk about symptoms and advised her to do a home covid test tonight or in the morning and call office with results. Patient has hx of asthma. Patient expressed understanding and will call back with results.

## 2021-08-13 ENCOUNTER — Ambulatory Visit (INDEPENDENT_AMBULATORY_CARE_PROVIDER_SITE_OTHER): Payer: PRIVATE HEALTH INSURANCE | Admitting: Pulmonary Disease

## 2021-08-13 ENCOUNTER — Other Ambulatory Visit: Payer: Self-pay

## 2021-08-13 ENCOUNTER — Encounter: Payer: Self-pay | Admitting: Pulmonary Disease

## 2021-08-13 VITALS — BP 100/70 | HR 66 | Temp 98.2°F | Ht 63.0 in | Wt 122.8 lb

## 2021-08-13 DIAGNOSIS — Z23 Encounter for immunization: Secondary | ICD-10-CM

## 2021-08-13 DIAGNOSIS — R0600 Dyspnea, unspecified: Secondary | ICD-10-CM | POA: Diagnosis not present

## 2021-08-13 MED ORDER — AMOXICILLIN-POT CLAVULANATE 875-125 MG PO TABS: 1.0000 | ORAL_TABLET | Freq: Two times a day (BID) | ORAL | 0 refills | Status: DC

## 2021-08-13 NOTE — Progress Notes (Signed)
Erica Khan    [id number]    September 16, 1981  Primary Care Physician:Worley, Lelon Mast, PA  Referring Physician: Jarold Motto, PA [address] [place],  [place] [address]  Chief complaint:   Follow-up for bronchiectasis Shortness of breath, sinus fullness  HPI: She has had a mild increase in shortness of breath Sinus fullness and congestion  No fever or chills  Continues to use Symbicort on a regular basis Does not usually need to use a rescue inhaler  Since symptoms started, she did have tobramycin nebulization at home which she started using  Occasional cough, minimal sputum production  Late 2021, completed a course of treatment with tobramycin and cefepime/meropenem -Significant improvement in symptoms following treatment with tobramycin in the past  She ended up at urgent care where she was treated with a course of doxycycline recently following an exacerbation Was called in a prescription for tobramycin following last visit, started to feel better soon after, so has not used the tobramycin  She has not been using the flutter device or hypertonic saline nebulizations  She last followed up with Dr.Comer, In January  Historically Chronic lung infection started at age 42 with bronchiectasis right lung Recent CT did show some involvement of the left  Uses nebulization treatments-uses hypertonic saline, has not been religious with its use Uses Mucinex intermittently  Since the cold weather she has had more symptoms, no fevers, no chills Not feeling acutely ill  She does have wheezing-Symbicort helps  She does have a regular cough, increased sputum the last few days  Has shortness of breath with wheezing Occasional throat discomfort  Was evaluated about 2018 in [place] with a chest x-ray and CT scan which I reviewed with the patient showing extensive bronchiectatic changes with completely destroyed right lung  She has had bronchoscopies at least twice  in [place], does not recollect any organism being cultured  Current culture did reveal Pseudomonas before treatment with antibiotics-tobramycin  Outpatient Encounter Medications as of 08/13/2021  Medication Sig   budesonide-formoterol (SYMBICORT) 160-4.5 MCG/ACT inhaler Inhale 2 puffs into the lungs 2 (two) times daily.   tobramycin, PF, (TOBI) 300 MG/5ML nebulizer solution Inhale 5 mLs (300 mg total) by nebulization in the morning and at bedtime. For 21 days   albuterol (VENTOLIN HFA) 108 (90 Base) MCG/ACT inhaler Inhale 2 puffs into the lungs every 6 (six) hours as needed. (Patient not taking: Reported on 08/13/2021)   HYDROcodone-acetaminophen (NORCO/VICODIN) 5-325 MG tablet Take 1 tablet by mouth every 4 (four) hours as needed for moderate pain. (Patient not taking: Reported on 08/13/2021)   sodium chloride 0.9 % nebulizer solution Take 3 mLs by nebulization 2 (two) times daily as needed for wheezing. (Patient not taking: Reported on 08/13/2021)   No facility-administered encounter medications on file as of 08/13/2021.    Allergies as of 08/13/2021 - Review Complete 08/13/2021  Allergen Reaction Noted   Cefepime Other (See Comments) 11/08/2020    Past Medical History:  Diagnosis Date   Asthma    Bronchitis     Past Surgical History:  Procedure Laterality Date   CESAREAN SECTION  2011    Family History  Problem Relation Age of Onset   Osteoarthritis Mother    Cancer Neg Hx     Social History   Socioeconomic History   Marital status: Single    Spouse name: Not on file   Number of children: Not on file   Years of education: Not on file  Highest education level: Not on file  Occupational History   Not on file  Tobacco Use   Smoking status: Never   Smokeless tobacco: Never  Vaping Use   Vaping Use: Never used  Substance and Sexual Activity   Alcohol use: Yes    Comment: occ   Drug use: Never   Sexual activity: Yes    Birth control/protection: Condom  Other Topics  Concern   Not on file  Social History Narrative   From [place]   Currently at [employer] to [place] at end of 2020   Social Determinants of Corporate investment banker Strain: Not on file  Food Insecurity: Not on file  Transportation Needs: Not on file  Physical Activity: Not on file  Stress: Not on file  Social Connections: Not on file  Intimate Partner Violence: Not on file    Review of Systems  Constitutional:  Positive for fatigue. Negative for fever.  HENT:  Positive for sinus pain.   Respiratory:  Positive for shortness of breath. Negative for cough.   Cardiovascular: Negative.    Vitals:   08/13/21 1613  BP: 100/70  Pulse: 66  Temp: 98.2 F (36.8 C)  SpO2: 99%     Physical Exam Constitutional:      Appearance: Normal appearance.  HENT:     Nose: No congestion or rhinorrhea.     Mouth/Throat:     Pharynx: No oropharyngeal exudate or posterior oropharyngeal erythema.  Cardiovascular:     Rate and Rhythm: Normal rate and regular rhythm.     Pulses: Normal pulses.     Heart sounds: Normal heart sounds. No murmur heard.   No friction rub.  Pulmonary:     Effort: Pulmonary effort is normal. No respiratory distress.     Breath sounds: No stridor. Rhonchi present. No wheezing.  Musculoskeletal:     Cervical back: No rigidity or tenderness.  Neurological:     Mental Status: She is alert.  Psychiatric:        Mood and Affect: Mood normal.   Data Reviewed:  2021 shows improvement in endobronchial collection  Last CT chest was 02/21/2021-loss of right lung parenchyma  Assessment:  Bronchiectasis  Sinusitis with exacerbation  Shortness of breath  Pseudomonas bronchiectasis -Not bringing up any secretions at present -Does not appear exacerbated at present  History of asthma  .  Continue Symbicort for asthma  For mucus clearance -Nebulization with hypertonic saline -Flutter device use   Plan/Recommendations: .  Continue Symbicort .  Course of  Augmentin called in .  We will get a flu shot today .  Follow-up in about 3 months  Encouraged to call with any significant concerns   Virl Diamond MD [facility] 08/13/2021, 4:23 PM  CC: Jarold Motto, PA

## 2021-08-13 NOTE — Patient Instructions (Signed)
Prescription for Augmentin sent into pharmacy  Administer flu shot today  Call with any significant concerns  I will see you in about 3 months

## 2021-08-13 NOTE — Telephone Encounter (Signed)
Negative covid test. Patient currently in the office to be seen. Nothing further needed at this time.

## 2021-08-14 ENCOUNTER — Encounter: Payer: Self-pay | Admitting: Pulmonary Disease

## 2021-08-17 ENCOUNTER — Other Ambulatory Visit (HOSPITAL_COMMUNITY)
Admission: RE | Admit: 2021-08-17 | Discharge: 2021-08-17 | Disposition: A | Discharge status: Home / Self Care | Payer: No Typology Code available for payment source | Source: Ambulatory Visit | Attending: Physician Assistant | Admitting: Physician Assistant

## 2021-08-17 ENCOUNTER — Other Ambulatory Visit: Payer: Self-pay

## 2021-08-17 ENCOUNTER — Ambulatory Visit (INDEPENDENT_AMBULATORY_CARE_PROVIDER_SITE_OTHER): Payer: No Typology Code available for payment source | Admitting: Physician Assistant

## 2021-08-17 ENCOUNTER — Encounter: Payer: Self-pay | Admitting: Physician Assistant

## 2021-08-17 VITALS — BP 110/70 | HR 72 | Temp 97.9°F | Ht 63.0 in | Wt 121.0 lb

## 2021-08-17 DIAGNOSIS — Z124 Encounter for screening for malignant neoplasm of cervix: Secondary | ICD-10-CM | POA: Diagnosis not present

## 2021-08-17 DIAGNOSIS — Z114 Encounter for screening for human immunodeficiency virus [HIV]: Secondary | ICD-10-CM

## 2021-08-17 DIAGNOSIS — Z136 Encounter for screening for cardiovascular disorders: Secondary | ICD-10-CM

## 2021-08-17 DIAGNOSIS — Z0001 Encounter for general adult medical examination with abnormal findings: Secondary | ICD-10-CM | POA: Diagnosis not present

## 2021-08-17 DIAGNOSIS — Z0182 Encounter for allergy testing: Secondary | ICD-10-CM | POA: Diagnosis not present

## 2021-08-17 DIAGNOSIS — R519 Headache, unspecified: Secondary | ICD-10-CM | POA: Diagnosis not present

## 2021-08-17 DIAGNOSIS — Z13220 Encounter for screening for lipoid disorders: Secondary | ICD-10-CM

## 2021-08-17 DIAGNOSIS — Z1159 Encounter for screening for other viral diseases: Secondary | ICD-10-CM

## 2021-08-17 NOTE — Progress Notes (Signed)
I acted as a Neurosurgeon for [employer], PA-C [employer], LPN   Subjective:    Erica Khan is a 40 y.o. female and is here for a comprehensive physical exam.  HPI  Health Maintenance Due  Topic Date Due   HIV Screening  Never done   Hepatitis C Screening  Never done   PAP SMEAR-Modifier  Never done   COVID-19 Vaccine (2 - Booster for Janssen series) 03/26/2020    Acute Concerns: HA -- R side of her head. Has been going on for a few days. Denies: worst HA of life, numbness/tingling of extremities, nausea, vomiting, changes in balance. Has not taken anything for her symptoms. Eye exam is UTD. Allergy testing -- having issues with chest tightness at times. While she does have underlying pulmonary issues, she would like to make sure that she does not any concerns with allergens that is contributing to her symptoms. She would like allergen testing.  Chronic Issues: Underlying lung issues are managed by pulmonology  Health Maintenance: Immunizations -- UTD; has not had the updated covid booster PAP -- Overdue, will do today Mammo -- N/A Colonoscopy -- N/A Bone Density -- N/A Diet -- eats very healthy; no juices or sodas Sleep habits -- poor sleep at baseline Exercise -- exercising most days of the week Current Weight -- Weight: 121 lb (54.9 kg)  Weight History: Wt Readings from Last 10 Encounters:  08/17/21 121 lb (54.9 kg)  08/13/21 122 lb 12.8 oz (55.7 kg)  03/31/21 121 lb 12.8 oz (55.2 kg)  03/02/21 122 lb (55.3 kg)  02/10/21 121 lb 3.2 oz (55 kg)  01/13/21 122 lb 12.8 oz (55.7 kg)  12/17/20 121 lb 9.6 oz (55.2 kg)  11/10/20 120 lb (54.4 kg)  11/05/20 122 lb (55.3 kg)  10/31/20 120 lb 2 oz (54.5 kg)   Body mass index is 21.43 kg/m. Mood -- overall good  Patient's last menstrual period was 07/17/2021. Period characteristics -- no concerns Birth control -- none  Does not see dentist or eye doctor    reports current alcohol use.  Tobacco Use: Low Risk     Smoking Tobacco Use: Never   Smokeless Tobacco Use: Never     Depression screen PHQ 2/9 08/17/2021  Decreased Interest 0  Down, Depressed, Hopeless 0  PHQ - 2 Score 0     Other providers/specialists: Patient Care Team: Jarold Motto, [place] as PCP - General (Physician Assistant)   PMHx, SurgHx, SocialHx, Medications, and Allergies were reviewed in the Visit Navigator and updated as appropriate.   Past Medical History:  Diagnosis Date   Asthma    Bronchitis      Past Surgical History:  Procedure Laterality Date   CESAREAN SECTION  2011     Family History  Problem Relation Age of Onset   Osteoarthritis Mother    Cancer Neg Hx     Social History   Tobacco Use   Smoking status: Never   Smokeless tobacco: Never  Vaping Use   Vaping Use: Never used  Substance Use Topics   Alcohol use: Yes    Comment: occ   Drug use: Never    Review of Systems:   Review of Systems  Constitutional:  Negative for chills, fever, malaise/fatigue and weight loss.  HENT:  Negative for hearing loss, sinus pain and sore throat.   Respiratory:  Negative for cough and hemoptysis.   Cardiovascular:  Negative for chest pain, palpitations, leg swelling and PND.  Gastrointestinal:  Negative  for abdominal pain, constipation, diarrhea, heartburn, nausea and vomiting.  Genitourinary:  Negative for dysuria, frequency and urgency.  Musculoskeletal:  Negative for back pain, myalgias and neck pain.  Skin:  Negative for itching and rash.  Neurological:  Negative for dizziness, tingling, seizures and headaches.  Endo/Heme/Allergies:  Negative for polydipsia.  Psychiatric/Behavioral:  Negative for depression. The patient is not nervous/anxious.    Objective:   BP 110/70 (BP Location: Left Arm, Patient Position: Sitting, Cuff Size: Normal)   Pulse 72   Temp 97.9 F (36.6 C) (Temporal)   Ht 5\' 3"  (1.6 m)   Wt 121 lb (54.9 kg)   LMP 07/17/2021   SpO2 98%   BMI 21.43 kg/m   General  Appearance:    Alert, cooperative, no distress, appears stated age  Head:    Normocephalic, without obvious abnormality, atraumatic  Eyes:    PERRL, conjunctiva/corneas clear, EOM's intact, fundi    benign, both eyes  Ears:    Normal TM's and external ear canals, both ears  Nose:   Nares normal, septum midline, mucosa normal, no drainage    or sinus tenderness  Throat:   Lips, mucosa, and tongue normal; teeth and gums normal  Neck:   Supple, symmetrical, trachea midline, no adenopathy;    thyroid:  no enlargement/tenderness/nodules; no carotid   bruit or JVD  Back:     Symmetric, no curvature, ROM normal, no CVA tenderness  Lungs:     Clear to auscultation bilaterally, respirations unlabored  Chest Wall:    No tenderness or deformity   Heart:    Regular rate and rhythm, S1 and S2 normal, no murmur, rub   or gallop  Breast Exam:    No tenderness, masses, or nipple abnormality  Abdomen:     Soft, non-tender, bowel sounds active all four quadrants,    no masses, no organomegaly  Genitalia:    Normal female without lesion, discharge or tenderness  Rectal:    Normal tone no masses or tenderness  Extremities:   Extremities normal, atraumatic, no cyanosis or edema  Pulses:   2+ and symmetric all extremities  Skin:   Skin color, texture, turgor normal, no rashes or lesions  Lymph nodes:   Cervical, supraclavicular, and axillary nodes normal  Neurologic:   CNII-XII intact, normal strength, sensation and reflexes    throughout    Assessment/Plan:   Encounter for general adult medical examination with abnormal findings Today patient counseled on age appropriate routine health concerns for screening and prevention, each reviewed and up to date or declined. Immunizations reviewed and up to date or declined. Labs ordered and reviewed. Risk factors for depression reviewed and negative. Hearing function and visual acuity are intact. ADLs screened and addressed as needed. Functional ability and level of  safety reviewed and appropriate. Education, counseling and referrals performed based on assessed risks today. Patient provided with a copy of personalized plan for preventive services.  Acute nonintractable headache, unspecified headache type Neuro exam benign; no red flag sx Possible tension HA Start NSAIDs If worsening symptoms or lack of improvement, recommend follow-up in office for further evaluation/mgmt  Encounter for allergy testing Will refer for testing per patient request  Encounter for lipid screening for cardiovascular disease Update lipid panel and provide recommendations accordingly  Pap smear for cervical cancer screening Performed today  Encounter for screening for other viral diseases Completed today  Screening for HIV (human immunodeficiency virus) Completed today     Patient Counseling:   [x]   Nutrition: Stressed importance of moderation in sodium/caffeine intake, saturated fat and cholesterol, caloric balance, sufficient intake of fresh fruits, vegetables, fiber, calcium, iron, and 1 mg of folate supplement per day (for females capable of pregnancy).   [x]      Stressed the importance of regular exercise.    [x]     Substance Abuse: Discussed cessation/primary prevention of tobacco, alcohol, or other drug use; driving or other dangerous activities under the influence; availability of treatment for abuse.    [x]      Injury prevention: Discussed safety belts, safety helmets, smoke detector, smoking near bedding or upholstery.    [x]      Sexuality: Discussed sexually transmitted diseases, partner selection, use of condoms, avoidance of unintended pregnancy  and contraceptive alternatives.    [x]     Dental health: Discussed importance of regular tooth brushing, flossing, and dental visits.   [x]      Health maintenance and immunizations reviewed. Please refer to Health maintenance section.   CMA or LPN served as scribe during this visit. History,  Physical, and Plan performed by medical provider. The above documentation has been reviewed and is accurate and complete.  , PA-C [facility]

## 2021-08-17 NOTE — Patient Instructions (Addendum)
It was great to see you!  For your headache -- trial as needed ibuprofen. If new/worsening symptoms, please let us know.  I have put in a referral to the allergist.  Please go to the lab for blood work.   Our office will call you with your results unless you have chosen to receive results via MyChart.  If your blood work is normal we will follow-up each year for physicals and as scheduled for chronic medical problems.  If anything is abnormal we will treat accordingly and get you in for a follow-up.  Take care,  Lelon Mast

## 2021-08-18 LAB — COMPREHENSIVE METABOLIC PANEL
ALT: 14 U/L (ref 0–35)
AST: 16 U/L (ref 0–37)
Albumin: 4.3 g/dL (ref 3.5–5.2)
Alkaline Phosphatase: 63 U/L (ref 39–117)
BUN: 10 mg/dL (ref 6–23)
CO2: 29 mEq/L (ref 19–32)
Calcium: 9.9 mg/dL (ref 8.4–10.5)
Chloride: 99 mEq/L (ref 96–112)
Creatinine, Ser: 0.61 mg/dL (ref 0.40–1.20)
GFR: 112.57 mL/min (ref >60.00)
Glucose, Bld: 88 mg/dL (ref 70–99)
Potassium: 3.7 mEq/L (ref 3.5–5.1)
Sodium: 136 mEq/L (ref 135–145)
Total Bilirubin: 0.5 mg/dL (ref 0.2–1.2)
Total Protein: 8.3 g/dL (ref 6.0–8.3)

## 2021-08-18 LAB — CBC WITH DIFFERENTIAL/PLATELET
Basophils Absolute: 0.1 10*3/uL (ref 0.0–0.1)
Basophils Relative: 1 % (ref 0.0–3.0)
Eosinophils Absolute: 0.5 10*3/uL (ref 0.0–0.7)
Eosinophils Relative: 5.3 % — ABNORMAL HIGH (ref 0.0–5.0)
HCT: 41.5 % (ref 36.0–46.0)
Hemoglobin: 14.1 g/dL (ref 12.0–15.0)
Lymphocytes Relative: 32 % (ref 12.0–46.0)
Lymphs Abs: 3.3 10*3/uL (ref 0.7–4.0)
MCHC: 34 g/dL (ref 30.0–36.0)
MCV: 79 fl (ref 78.0–100.0)
Monocytes Absolute: 0.5 10*3/uL (ref 0.1–1.0)
Monocytes Relative: 5 % (ref 3.0–12.0)
Neutro Abs: 5.9 10*3/uL (ref 1.4–7.7)
Neutrophils Relative %: 56.7 % (ref 43.0–77.0)
Platelets: 343 10*3/uL (ref 150.0–400.0)
RBC: 5.25 Mil/uL — ABNORMAL HIGH (ref 3.87–5.11)
RDW: 13.1 % (ref 11.5–15.5)
WBC: 10.3 10*3/uL (ref 4.0–10.5)

## 2021-08-18 LAB — LIPID PANEL
Cholesterol: 120 mg/dL (ref 0–200)
HDL: 54.8 mg/dL (ref >39.00)
LDL Cholesterol: 56 mg/dL (ref 0–99)
NonHDL: 65.29
Total CHOL/HDL Ratio: 2
Triglycerides: 45 mg/dL (ref 0.0–149.0)
VLDL: 9 mg/dL (ref 0.0–40.0)

## 2021-08-18 LAB — HEPATITIS C ANTIBODY
Hepatitis C Ab: NONREACTIVE
SIGNAL TO CUT-OFF: 0.02 (ref <1.00)

## 2021-08-18 LAB — HIV ANTIBODY (ROUTINE TESTING W REFLEX): HIV 1&2 Ab, 4th Generation: NONREACTIVE

## 2021-08-21 LAB — CYTOLOGY - PAP
Comment: NEGATIVE
Diagnosis: NEGATIVE
High risk HPV: NEGATIVE

## 2021-10-13 ENCOUNTER — Ambulatory Visit: Payer: PRIVATE HEALTH INSURANCE | Admitting: Pulmonary Disease

## 2021-10-21 ENCOUNTER — Encounter: Payer: Self-pay | Admitting: Allergy

## 2021-10-21 ENCOUNTER — Ambulatory Visit (INDEPENDENT_AMBULATORY_CARE_PROVIDER_SITE_OTHER): Payer: No Typology Code available for payment source | Admitting: Allergy

## 2021-10-21 ENCOUNTER — Other Ambulatory Visit: Payer: Self-pay

## 2021-10-21 VITALS — BP 104/80 | HR 92 | Temp 98.1°F | Resp 16 | Ht 63.0 in | Wt 122.8 lb

## 2021-10-21 DIAGNOSIS — T781XXD Other adverse food reactions, not elsewhere classified, subsequent encounter: Secondary | ICD-10-CM

## 2021-10-21 DIAGNOSIS — Z8709 Personal history of other diseases of the respiratory system: Secondary | ICD-10-CM

## 2021-10-21 DIAGNOSIS — T7840XA Allergy, unspecified, initial encounter: Secondary | ICD-10-CM

## 2021-10-21 MED ORDER — EPINEPHRINE 0.3 MG/0.3ML IJ SOAJ: 0.3000 mg | INTRAMUSCULAR | 1 refills | Status: DC | PRN

## 2021-10-21 NOTE — Progress Notes (Signed)
New Patient Note  RE: Mikiyah Glasner MRN: [id number] DOB: Nov 30, 1980 Date of Office Visit: 10/21/2021  Referring provider: Jarold Motto, PA Primary care provider: Jarold Motto, PA  Chief Complaint: allergies and asthma  History of present illness: Erica Khan is a 40 y.o. female presenting today for consultation for allergy evaluation in the setting of asthma. She sees a pulmonologist for history of Pseudomonas bronchiectasis and asthma therapy.  She states it has been recommended that she have an allergy evaluation to see what may be flaring her asthma.  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  She has had a CT that also show some involvement of the left lung.  She uses Symbicort.  She also has used hypertonic saline in the past and has been recommended on her most recent pulmonary visit to continue use for mucus clearance as well as use of flutter valve.  She has had several bronchoscopies while she was living in [place].  She most recently has had sputum culture positive Pseudomonas treated with tobramycin.  She does states her asthma/respiratory symptoms are worse during winter season and slightly during pollen season.   She states her wheezing has increased and can worsen after she eats.  She has not been able to pinpoint any specific foods that seem to drive this.  She states she is a healthy eater and eats mostly home cooked meals.  She does feel like when she eats "junk food" or unhealthier foods she does not more wheezing however states she doesn't eat these types of foods often.    No history of eczema.    Review of systems in the past 4 weeks: Review of Systems  Constitutional: Negative.   HENT: Negative.    Eyes: Negative.   Respiratory:  Positive for wheezing.   Cardiovascular: Negative.   Gastrointestinal: Negative.   Musculoskeletal: Negative.   Skin: Negative.   Neurological: Negative.    All other  systems negative unless noted above in HPI  Past medical history: Past Medical History:  Diagnosis Date   Asthma    Bronchitis     Past surgical history: Past Surgical History:  Procedure Laterality Date   BRONCHOSCOPY N/A    over seven years ago   CESAREAN SECTION  2011    Family history:  Family History  Problem Relation Age of Onset   Osteoarthritis Mother    Cancer Neg Hx     Social history: Lives in a home with carpeting in bedroom with electric heating and central cooling.  No pets in the home.  Dogs and cats outside the home.  No concern for water damage, mildew or roaches in the home.  In masters program.  Denies smoking history.     Medication List: Current Outpatient Medications  Medication Sig Dispense Refill   albuterol (VENTOLIN HFA) 108 (90 Base) MCG/ACT inhaler Inhale 2 puffs into the lungs every 6 (six) hours as needed. 18 g 5   budesonide-formoterol (SYMBICORT) 160-4.5 MCG/ACT inhaler Inhale 2 puffs into the lungs 2 (two) times daily. 1 each 11   EPINEPHrine (AUVI-Q) 0.3 mg/0.3 mL IJ SOAJ injection Inject 0.3 mg into the muscle as needed for anaphylaxis. 1 each 1   sodium chloride 0.9 % nebulizer solution Take 3 mLs by nebulization 2 (two) times daily as needed for wheezing. 90 mL 11   tobramycin, PF, (TOBI) 300 MG/5ML nebulizer solution Inhale 5 mLs (300 mg total) by nebulization in  the morning and at bedtime. For 21 days 280 mL 0   No current facility-administered medications for this visit.    Known medication allergies: Allergies  Allergen Reactions   Cefepime Other (See Comments)    Diffuse rash and wheezing     Physical examination: Blood pressure 104/80, pulse 92, temperature 98.1 F (36.7 C), temperature source Temporal, resp. rate 16, height 5\' 3"  (1.6 m), weight 122 lb 12.8 oz (55.7 kg), SpO2 97 %.  General: Alert, interactive, in no acute distress. HEENT: PERRLA, TMs pearly gray, turbinates non-edematous without discharge, post-pharynx  non erythematous. Neck: Supple without lymphadenopathy. Lungs: Left lung is clear, right lung with loud rhonchi . {no increased work of breathing. CV: Normal S1, S2 without murmurs. Abdomen: Nondistended, nontender. Skin: Warm and dry, without lesions or rashes. Extremities:  No clubbing, cyanosis or edema. Neuro:   Grossly intact.  Diagnositics/Labs:  Allergy testing: Environmental allergy skin prick testing is positive to sweet vernal, Timothy, maple, pecan, Cladosporium, dust mite DP, cats and cockroach. Select food allergy skin prick testing is positive to oat Allergy testing results were read and interpreted by provider, documented by clinical staff.   Assessment and plan: Adverse food reaction History of asthma   -environmental allergy testing is positive to grass pollen, tree pollen, mold, dust mite, cat and cockroach -allergen avoidance measures discussed/handouts provided -for general allergy symptom control (ie. Sneezing, runny/stuffy nose, itchy/watery eyes etc) can use long-acting antihistamine like Allegra, Zyrtec or Xyzal daily as needed  -food allergy testing is positive to Oat - recommend avoidance of Oat in diet  -have access to self-injectable epinephrine (Epipen or AuviQ) 0.3mg  at all times -follow emergency action plan in case of allergic reaction  - we have discussed the following in regards to foods:   Allergy: food allergy is when you have eaten a food, developed an allergic reaction after eating the food and have IgE to the food (positive food testing either by skin testing or blood testing).  Food allergy could lead to life threatening symptoms  Sensitivity: occurs when you have IgE to a food (positive food testing either by skin testing or blood testing) but is a food you eat without any issues.  This is not an allergy and we recommend keeping the food in the diet  Intolerance: this is when you have negative testing by either skin testing or blood testing  thus not allergic but the food causes symptoms (like belly pain, bloating, diarrhea etc) with ingestion.  These foods should be avoided to prevent symptoms.    -continue your current asthma regimen with Symbicort, hypertonic saline neb and flutter valve use as per your pulmonologist  Follow-up in 6 months or sooner if needed  I appreciate the opportunity to take part in Jenna's care. Please do not hesitate to contact me with questions.  Sincerely,   , MD Allergy/Immunology [facility]

## 2021-10-21 NOTE — Patient Instructions (Addendum)
-  environmental allergy testing is positive to grass pollen, tree pollen, mold, dust mite, cat and cockroach -allergen avoidance measures discussed/handouts provided -for general allergy symptom control (ie. Sneezing, runny/stuffy nose, itchy/watery eyes etc) can use long-acting antihistamine like Allegra, Zyrtec or Xyzal daily as needed  -food allergy testing is positive to Oat - recommend avoidance of Oat in diet  -have access to self-injectable epinephrine (Epipen or AuviQ) 0.3mg  at all times -follow emergency action plan in case of allergic reaction  - we have discussed the following in regards to foods:   Allergy: food allergy is when you have eaten a food, developed an allergic reaction after eating the food and have IgE to the food (positive food testing either by skin testing or blood testing).  Food allergy could lead to life threatening symptoms  Sensitivity: occurs when you have IgE to a food (positive food testing either by skin testing or blood testing) but is a food you eat without any issues.  This is not an allergy and we recommend keeping the food in the diet  Intolerance: this is when you have negative testing by either skin testing or blood testing thus not allergic but the food causes symptoms (like belly pain, bloating, diarrhea etc) with ingestion.  These foods should be avoided to prevent symptoms.    -continue your current asthma regimen with Symbicort, hypertonic saline neb and flutter valve use as per your pulmonologist  Follow-up in 6 months or sooner if needed

## 2021-10-30 ENCOUNTER — Ambulatory Visit: Payer: No Typology Code available for payment source | Admitting: Pulmonary Disease

## 2021-10-30 ENCOUNTER — Encounter: Payer: Self-pay | Admitting: Pulmonary Disease

## 2021-10-30 ENCOUNTER — Other Ambulatory Visit: Payer: Self-pay

## 2021-10-30 VITALS — BP 106/70 | HR 69 | Temp 98.0°F | Ht 63.0 in | Wt 121.0 lb

## 2021-10-30 DIAGNOSIS — J479 Bronchiectasis, uncomplicated: Secondary | ICD-10-CM | POA: Diagnosis not present

## 2021-10-30 MED ORDER — AMOXICILLIN-POT CLAVULANATE 875-125 MG PO TABS: 1.0000 | ORAL_TABLET | Freq: Two times a day (BID) | ORAL | 0 refills | Status: DC

## 2021-10-30 MED ORDER — ZOLPIDEM TARTRATE 10 MG PO TABS: 10.0000 mg | ORAL_TABLET | Freq: Every evening | ORAL | 1 refills | Status: DC | PRN

## 2021-10-30 NOTE — Patient Instructions (Signed)
For difficulty sleeping-prescription for Ambien will be sent into pharmacy  Prescription for Augmentin will be sent to pharmacy  I will see you back in about 3 months  Enjoy your trip

## 2021-10-30 NOTE — Progress Notes (Signed)
Erica Khan    [id number]    30-Nov-1980  Primary Care Physician:Worley, Lelon Mast, PA  Referring Physician: Jarold Motto, PA [address] [place],  [place] [address]  Chief complaint:   Has been doing relatively well recently  HPI: No significant exacerbation recently  Continues to use Mucinex, as needed nebulization treatments No fevers or chills Breathing feels relatively well  Symbicort use No use of rescue inhaler  Has been having some issues with insomnia  Since symptoms started, she did have tobramycin nebulization at home which she started using  Occasional cough, minimal sputum production  Late 2021, completed a course of treatment with tobramycin and cefepime/meropenem -Significant improvement in symptoms following treatment with tobramycin in the past  She ended up at urgent care where she was treated with a course of doxycycline recently following an exacerbation Was called in a prescription for tobramycin following last visit, started to feel better soon after, so has not used the tobramycin  She has not been using the flutter device or hypertonic saline nebulizations  She last followed up with Dr.Comer, In January  Historically Chronic lung infection started at age 35 with bronchiectasis right lung Recent CT did show some involvement of the left  Uses nebulization treatments-uses hypertonic saline, has not been religious with its use Uses Mucinex intermittently  Since the cold weather she has had more symptoms, no fevers, no chills Not feeling acutely ill  She does have wheezing-Symbicort helps  She does have a regular cough, increased sputum the last few days  Has shortness of breath with wheezing Occasional throat discomfort  Was evaluated about 2018 in [place] with a chest x-ray and CT scan which I reviewed with the patient showing extensive bronchiectatic changes with completely destroyed right lung  She has had bronchoscopies  at least twice in [place], does not recollect any organism being cultured  Current culture did reveal Pseudomonas before treatment with antibiotics-tobramycin  Outpatient Encounter Medications as of 10/30/2021  Medication Sig   albuterol (VENTOLIN HFA) 108 (90 Base) MCG/ACT inhaler Inhale 2 puffs into the lungs every 6 (six) hours as needed.   budesonide-formoterol (SYMBICORT) 160-4.5 MCG/ACT inhaler Inhale 2 puffs into the lungs 2 (two) times daily.   EPINEPHrine (AUVI-Q) 0.3 mg/0.3 mL IJ SOAJ injection Inject 0.3 mg into the muscle as needed for anaphylaxis.   sodium chloride 0.9 % nebulizer solution Take 3 mLs by nebulization 2 (two) times daily as needed for wheezing.   tobramycin, PF, (TOBI) 300 MG/5ML nebulizer solution Inhale 5 mLs (300 mg total) by nebulization in the morning and at bedtime. For 21 days   No facility-administered encounter medications on file as of 10/30/2021.    Allergies as of 10/30/2021 - Review Complete 10/30/2021  Allergen Reaction Noted   Cefepime Other (See Comments) 11/08/2020    Past Medical History:  Diagnosis Date   Asthma    Bronchitis     Past Surgical History:  Procedure Laterality Date   BRONCHOSCOPY N/A    over seven years ago   CESAREAN SECTION  2011    Family History  Problem Relation Age of Onset   Osteoarthritis Mother    Cancer Neg Hx     Social History   Socioeconomic History   Marital status: Single    Spouse name: Not on file   Number of children: Not on file   Years of education: Not on file   Highest education level: Not on file  Occupational  History   Not on file  Tobacco Use   Smoking status: Never   Smokeless tobacco: Never  Vaping Use   Vaping Use: Never used  Substance and Sexual Activity   Alcohol use: Yes    Comment: occ   Drug use: Never   Sexual activity: Yes    Birth control/protection: Condom  Other Topics Concern   Not on file  Social History Narrative   From [place]   Currently at [employer]  to [place] at end of 2020   Social Determinants of Corporate investment banker Strain: Not on file  Food Insecurity: Not on file  Transportation Needs: Not on file  Physical Activity: Not on file  Stress: Not on file  Social Connections: Not on file  Intimate Partner Violence: Not on file    Review of Systems  Constitutional:  Positive for fatigue. Negative for fever.  HENT:  Positive for sinus pain.   Respiratory:  Positive for shortness of breath. Negative for cough.   Cardiovascular: Negative.   Psychiatric/Behavioral:  Positive for sleep disturbance.    Vitals:   10/30/21 0858  BP: 106/70  Pulse: 69  Temp: 98 F (36.7 C)  SpO2: 97%     Physical Exam Constitutional:      Appearance: Normal appearance.  HENT:     Head: Normocephalic.     Nose: No congestion or rhinorrhea.     Mouth/Throat:     Pharynx: No oropharyngeal exudate or posterior oropharyngeal erythema.  Cardiovascular:     Rate and Rhythm: Normal rate and regular rhythm.     Pulses: Normal pulses.     Heart sounds: Normal heart sounds. No murmur heard.   No friction rub.  Pulmonary:     Effort: Pulmonary effort is normal. No respiratory distress.     Breath sounds: No stridor. Rhonchi present. No wheezing.  Musculoskeletal:     Cervical back: No rigidity or tenderness.  Neurological:     Mental Status: She is alert.  Psychiatric:        Mood and Affect: Mood normal.   Data Reviewed:  2021 shows improvement in endobronchial collection  Last CT chest was 02/21/2021-loss of right lung parenchyma  Assessment:  Bronchiectasis -Has been stable  Shortness of breath  Pseudomonas bronchiectasis -Not bringing up any secretions at present -Does not appear exacerbated at present  History of asthma  .  Continue Symbicort for asthma  For mucus clearance -Nebulization with hypertonic saline -Flutter device use  Insomnia -We will try with Ambien   Plan/Recommendations: .  Continue Symbicort .   Course of Augmentin called in to be used during travel .  Prescription for Ambien sent in .  Follow-up in about 3 months  Encouraged to call with any significant concerns   Virl Diamond MD [facility] 10/30/2021, 9:17 AM  CC: Jarold Motto, PA

## 2021-11-02 ENCOUNTER — Encounter: Payer: Self-pay | Admitting: Pulmonary Disease

## 2021-11-02 DIAGNOSIS — J479 Bronchiectasis, uncomplicated: Secondary | ICD-10-CM

## 2021-11-03 MED ORDER — ZOLPIDEM TARTRATE 10 MG PO TABS: 10.0000 mg | ORAL_TABLET | Freq: Every evening | ORAL | 1 refills | Status: DC | PRN

## 2021-11-03 MED ORDER — AMOXICILLIN-POT CLAVULANATE 875-125 MG PO TABS: 1.0000 | ORAL_TABLET | Freq: Two times a day (BID) | ORAL | 0 refills | Status: DC

## 2021-11-30 ENCOUNTER — Other Ambulatory Visit (HOSPITAL_COMMUNITY): Payer: Self-pay

## 2021-12-02 MED ORDER — BUDESONIDE-FORMOTEROL FUMARATE 160-4.5 MCG/ACT IN AERO: 2.0000 | INHALATION_SPRAY | Freq: Two times a day (BID) | RESPIRATORY_TRACT | 11 refills | Status: DC

## 2021-12-06 ENCOUNTER — Encounter: Payer: Self-pay | Admitting: Pulmonary Disease

## 2021-12-08 MED ORDER — PROMETHAZINE-CODEINE 6.25-10 MG/5ML PO SYRP: 5.0000 mL | ORAL_SOLUTION | ORAL | 0 refills | Status: DC | PRN

## 2021-12-08 NOTE — Telephone Encounter (Signed)
Dr. Val Eagle, please see mychart messages from pt.

## 2021-12-08 NOTE — Telephone Encounter (Signed)
Prescription for cough medicine sent to [facility]

## 2021-12-09 ENCOUNTER — Other Ambulatory Visit: Payer: Self-pay | Admitting: Pulmonary Disease

## 2021-12-09 MED ORDER — HYDROCODONE BIT-HOMATROP MBR 5-1.5 MG/5ML PO SOLN
5.0000 mL | Freq: Four times a day (QID) | ORAL | 0 refills | Status: DC | PRN
Start: 2021-12-09 — End: 2022-04-13

## 2021-12-09 NOTE — Telephone Encounter (Signed)
Thank you so much doctor. First of all let me wish you a very Happy new year.    I got a call from [facility] saying [facility] and [facility] are not refilling that medicine at all. My options are with [facility] or [facility] [address].   Thank you

## 2021-12-17 ENCOUNTER — Other Ambulatory Visit (HOSPITAL_COMMUNITY): Payer: Self-pay

## 2021-12-18 ENCOUNTER — Other Ambulatory Visit (HOSPITAL_COMMUNITY): Payer: Self-pay

## 2022-01-26 ENCOUNTER — Other Ambulatory Visit: Payer: Self-pay | Admitting: Pulmonary Disease

## 2022-01-26 MED ORDER — AMOXICILLIN-POT CLAVULANATE 875-125 MG PO TABS: 1.0000 | ORAL_TABLET | Freq: Two times a day (BID) | ORAL | 0 refills | Status: AC

## 2022-01-26 MED ORDER — SODIUM CHLORIDE 3 % IN NEBU: INHALATION_SOLUTION | RESPIRATORY_TRACT | 6 refills | Status: DC | PRN

## 2022-01-26 NOTE — Telephone Encounter (Signed)
Called in prescription for Augmentin-antibiotic and hypertonic saline nebulizations ? ?Hypertonic saline nebulization will help with mucus clearance, continue Mucinex ? ?Request for sputum Gram stain and cultures ? ?Continue all other medications ? ? ?This may be an exacerbation of your bronchiectasis, may also just be a viral infection that is exacerbating underlying problem. ?

## 2022-02-09 ENCOUNTER — Encounter: Payer: Self-pay | Admitting: Pulmonary Disease

## 2022-02-10 ENCOUNTER — Telehealth: Payer: Self-pay | Admitting: Pulmonary Disease

## 2022-02-10 MED ORDER — NIRMATRELVIR/RITONAVIR (PAXLOVID)TABLET: 3.0000 | ORAL_TABLET | Freq: Two times a day (BID) | ORAL | 0 refills | Status: AC

## 2022-02-10 NOTE — Telephone Encounter (Signed)
ATC x1.  LVM to return call. 

## 2022-02-10 NOTE — Telephone Encounter (Signed)
Treatment is still supportive ? ?Cough medicine, something for fever ? ?I will send in a prescription for Paxlovid, this is with the assumption that the COVID is new and is not what has been causing her symptoms all the while, otherwise, may not do much ? ?Somebody with limited lung function will unfortunately be more symptomatic ?

## 2022-02-10 NOTE — Telephone Encounter (Signed)
Called and spoke with patient. She stated that she tested positive for covid last night around 9am. She has been having some cold symptoms for the past 2 weeks that did not respond the amoxicillin that was sent in on 01/26/22. Denied have any fevers today but she does feel very tired. She has not been prescribed anti-virals.  ? ?She confirmed that she has been using her Symbicort, albuterol and sodium chloride.  ? ?Pharmacy is [facility].  ? ?AO, can you please advise? Thanks!  ?

## 2022-02-10 NOTE — Telephone Encounter (Signed)
ATC patient, per DPR left detailed message letting her know that RX has been sent in to pharmacy. And to take OTC cough syrup and something for fever if she has one. Advised her to call back with any questions. Nothing further needed at this time.  ?

## 2022-02-11 NOTE — Telephone Encounter (Signed)
Spoke with the pt  ?She states started paxlovid last night  ?She took 3 tablets and was concerned that the dose was different bc on the pill pack it says am dose  ?I explained that it's the same dose, as long as she is taking 3 tabs in the am and 3 in the pm this is correct  ?Pt verbalized understanding  ?She is feeling much better today  ?Nothing further needed ?

## 2022-02-14 DIAGNOSIS — Z03818 Encounter for observation for suspected exposure to other biological agents ruled out: Secondary | ICD-10-CM | POA: Diagnosis not present

## 2022-02-14 DIAGNOSIS — Z20822 Contact with and (suspected) exposure to covid-19: Secondary | ICD-10-CM | POA: Diagnosis not present

## 2022-03-02 ENCOUNTER — Ambulatory Visit: Payer: No Typology Code available for payment source | Admitting: Physician Assistant

## 2022-03-02 VITALS — BP 90/60 | HR 81 | Temp 98.3°F | Ht 63.0 in | Wt 116.4 lb

## 2022-03-02 DIAGNOSIS — R300 Dysuria: Secondary | ICD-10-CM

## 2022-03-02 LAB — POCT URINALYSIS DIPSTICK
Bilirubin, UA: NEGATIVE
Blood, UA: NEGATIVE
Glucose, UA: NEGATIVE
Ketones, UA: NEGATIVE
Leukocytes, UA: NEGATIVE
Nitrite, UA: NEGATIVE
Protein, UA: POSITIVE — AB
Spec Grav, UA: 1.02 (ref 1.010–1.025)
Urobilinogen, UA: 0.2 E.U./dL
pH, UA: 5.5 (ref 5.0–8.0)

## 2022-03-02 LAB — POCT URINE PREGNANCY: Preg Test, Ur: NEGATIVE

## 2022-03-02 MED ORDER — NITROFURANTOIN MONOHYD MACRO 100 MG PO CAPS: 100.0000 mg | ORAL_CAPSULE | Freq: Two times a day (BID) | ORAL | 0 refills | Status: DC

## 2022-03-02 NOTE — Progress Notes (Signed)
Erica Khan is a 41 y.o. female here for urinary frequency. ? ?History of Present Illness:  ? ?Chief Complaint  ?Patient presents with  ? Urinary Frequency  ?  Pt stated that all her symptoms started yesterday. Stated that when she pee she will feel some burning and when she wipes, she seen some orange on the tissue and on the toilet. No smell or no itching.  ? ?Urinary Frequency  ?Erica Khan presents with c/o urinary frequency that has began yesterday. In addition to this she has been experiencing some burning while urinating and seeing an orange fluid upon wiping. In an effort to manage this, she has been drinking cranberry juice and water which has provided relief in terms of the burning sensation. She has recently experienced constipation but this has since resolved. She has had one UTI throughout her lifetime and states current sx are similar. Denies fever, chills, lower back pain, vaginal discharge, or use of OTC medications.  ? ?Past Medical History:  ?Diagnosis Date  ? Asthma   ? Bronchitis   ? ?  ?Social History  ? ?Tobacco Use  ? Smoking status: Never  ? Smokeless tobacco: Never  ?Vaping Use  ? Vaping Use: Never used  ?Substance Use Topics  ? Alcohol use: Yes  ?  Comment: occ  ? Drug use: Never  ? ? ?Past Surgical History:  ?Procedure Laterality Date  ? BRONCHOSCOPY N/A   ? over seven years ago  ? CESAREAN SECTION  2011  ? ? ?Family History  ?Problem Relation Age of Onset  ? Osteoarthritis Mother   ? Cancer Neg Hx   ? ? ?Allergies  ?Allergen Reactions  ? Cefepime Other (See Comments)  ?  Diffuse rash and wheezing  ? ? ?Current Medications:  ? ?Current Outpatient Medications:  ?  HYDROcodone bit-homatropine (HYDROMET) 5-1.5 MG/5ML syrup, Take 5 mLs by mouth every 6 (six) hours as needed for cough., Disp: 120 mL, Rfl: 0 ?  albuterol (VENTOLIN HFA) 108 (90 Base) MCG/ACT inhaler, Inhale 2 puffs into the lungs every 6 (six) hours as needed., Disp: 18 g, Rfl: 5 ?  amoxicillin-clavulanate (AUGMENTIN) 875-125 MG  tablet, Take 1 tablet by mouth 2 (two) times daily., Disp: 20 tablet, Rfl: 0 ?  budesonide-formoterol (SYMBICORT) 160-4.5 MCG/ACT inhaler, Inhale 2 puffs into the lungs 2 (two) times daily., Disp: 1 each, Rfl: 11 ?  EPINEPHrine (AUVI-Q) 0.3 mg/0.3 mL IJ SOAJ injection, Inject 0.3 mg into the muscle as needed for anaphylaxis., Disp: 1 each, Rfl: 1 ?  sodium chloride 0.9 % nebulizer solution, Take 3 mLs by nebulization 2 (two) times daily as needed for wheezing., Disp: 90 mL, Rfl: 11 ?  sodium chloride HYPERTONIC 3 % nebulizer solution, Take by nebulization as needed for other., Disp: 750 mL, Rfl: 6 ?  tobramycin, PF, (TOBI) 300 MG/5ML nebulizer solution, Inhale 5 mLs (300 mg total) by nebulization in the morning and at bedtime. For 21 days, Disp: 280 mL, Rfl: 0 ?  zolpidem (AMBIEN) 10 MG tablet, Take 1 tablet (10 mg total) by mouth at bedtime as needed for sleep., Disp: 30 tablet, Rfl: 1  ? ?Review of Systems:  ? ?Review of Systems  ?Genitourinary:  Positive for frequency.  ?Negative unless otherwise specified per HPI. ?Vitals:  ? ?Vitals:  ? 03/02/22 1136  ?BP: 90/60  ?Pulse: 81  ?Temp: 98.3 ?F (36.8 ?C)  ?SpO2: 98%  ?Weight: 116 lb 6.4 oz (52.8 kg)  ?Height: 5\' 3"  (1.6 m)  ?   ?Body mass  index is 20.62 kg/m?. ? ?Physical Exam:  ? ?Physical Exam ?Vitals and nursing note reviewed.  ?Constitutional:   ?   General: She is not in acute distress. ?   Appearance: She is well-developed. She is not ill-appearing or toxic-appearing.  ?Cardiovascular:  ?   Rate and Rhythm: Normal rate and regular rhythm.  ?   Pulses: Normal pulses.  ?   Heart sounds: Normal heart sounds, S1 normal and S2 normal.  ?Pulmonary:  ?   Effort: Pulmonary effort is normal.  ?   Breath sounds: Normal breath sounds.  ?Abdominal:  ?   General: Abdomen is flat. Bowel sounds are normal.  ?   Palpations: Abdomen is soft.  ?   Tenderness: There is abdominal tenderness in the suprapubic area.  ?Skin: ?   General: Skin is warm and dry.  ?Neurological:  ?    Mental Status: She is alert.  ?   GCS: GCS eye subscore is 4. GCS verbal subscore is 5. GCS motor subscore is 6.  ?Psychiatric:     ?   Speech: Speech normal.     ?   Behavior: Behavior normal. Behavior is cooperative.  ? ?Results for orders placed or performed in visit on 03/02/22  ?POCT Urinalysis Dipstick  ?Result Value Ref Range  ? Color, UA yellow   ? Clarity, UA clear   ? Glucose, UA Negative Negative  ? Bilirubin, UA negative   ? Ketones, UA negative   ? Spec Grav, UA 1.020 1.010 - 1.025  ? Blood, UA negative   ? pH, UA 5.5 5.0 - 8.0  ? Protein, UA Positive (A) Negative  ? Urobilinogen, UA 0.2 0.2 or 1.0 E.U./dL  ? Nitrite, UA negative   ? Leukocytes, UA Negative Negative  ? Appearance    ? Odor    ?POCT urine pregnancy  ?Result Value Ref Range  ? Preg Test, Ur Negative Negative  ? ? ? ?Assessment and Plan:  ? ?Dysuria ?UA overall normal; urine preg negative ?Encouraged patient to continue to push more fluids-- at least 64 oz of water daily  ?I provided rx for macrobid if sx worsen  ?Urine culture pending and will add/adjust treatment if warranted ?Follow up if new/worsening symptoms or concerns occur  ? ?I,Erica Khan,acting as a scribe for [employer], PA.,have documented all relevant documentation on the behalf of Erica Coke, PA,as directed by  Erica Coke, PA while in the presence of Erica Khan, [place]. ? ?IInda Coke, PA, have reviewed all documentation for this visit. The documentation on 03/02/22 for the exam, diagnosis, procedures, and orders are all accurate and complete. ? ?Erica Coke, PA-C ? ?

## 2022-03-03 LAB — URINE CULTURE
MICRO NUMBER:: 13247731
SPECIMEN QUALITY:: ADEQUATE

## 2022-04-12 NOTE — Progress Notes (Unsigned)
@Patient  ID: Erica Khan, female    DOB: [birth year], 41 y.o.   MRN: [id number]  No chief complaint on file.   Referring provider: Inda Coke, PA  HPI: 41 year old female, never smoked. PMH significant for bronchiectasis, psenudomonas. Patient of Dr. Ander Slade, last seen in December 2022. Bronchiectatic changed right lung noted in 2018 in [place]. 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 Completed course of treatment with tobramycin nebs and cefepime/meropenem in late 2021. Maintained on Symbicort   04/13/2022 Patient presents today for acute visit.    Mucinex Hypertonic neb  Allergies  Allergen Reactions   Cefepime Other (See Comments)    Diffuse rash and wheezing    Immunization History  Administered Date(s) Administered   Influenza,inj,Quad PF,6+ Mos 08/13/2021   Janssen (J&J) SARS-COV-2 Vaccination 01/30/2020    Past Medical History:  Diagnosis Date   Asthma    Bronchitis     Tobacco History: Social History   Tobacco Use  Smoking Status Never  Smokeless Tobacco Never   Counseling given: Not Answered   Outpatient Medications Prior to Visit  Medication Sig Dispense Refill   albuterol (VENTOLIN HFA) 108 (90 Base) MCG/ACT inhaler Inhale 2 puffs into the lungs every 6 (six) hours as needed. 18 g 5   budesonide-formoterol (SYMBICORT) 160-4.5 MCG/ACT inhaler Inhale 2 puffs into the lungs 2 (two) times daily. 1 each 11   EPINEPHrine (AUVI-Q) 0.3 mg/0.3 mL IJ SOAJ injection Inject 0.3 mg into the muscle as needed for anaphylaxis. 1 each 1   HYDROcodone bit-homatropine (HYDROMET) 5-1.5 MG/5ML syrup Take 5 mLs by mouth every 6 (six) hours as needed for cough. 120 mL 0   nitrofurantoin, macrocrystal-monohydrate, (MACROBID) 100 MG capsule Take 1 capsule (100 mg total) by mouth 2 (two) times daily. 10 capsule 0   sodium chloride 0.9 % nebulizer solution Take 3 mLs by nebulization 2 (two) times daily as needed for wheezing. 90 mL 11   sodium chloride HYPERTONIC 3 %  nebulizer solution Take by nebulization as needed for other. 750 mL 6   tobramycin, PF, (TOBI) 300 MG/5ML nebulizer solution Inhale 5 mLs (300 mg total) by nebulization in the morning and at bedtime. For 21 days 280 mL 0   zolpidem (AMBIEN) 10 MG tablet Take 1 tablet (10 mg total) by mouth at bedtime as needed for sleep. 30 tablet 1   No facility-administered medications prior to visit.      Review of Systems  Review of Systems   Physical Exam  There were no vitals taken for this visit. Physical Exam   Lab Results:  CBC    Component Value Date/Time   WBC 10.3 08/17/2021 1556   RBC 5.25 (H) 08/17/2021 1556   HGB 14.1 08/17/2021 1556   HCT 41.5 08/17/2021 1556   PLT 343.0 08/17/2021 1556   MCV 79.0 08/17/2021 1556   MCH 26.7 11/10/2020 2144   MCHC 34.0 08/17/2021 1556   RDW 13.1 08/17/2021 1556   LYMPHSABS 3.3 08/17/2021 1556   MONOABS 0.5 08/17/2021 1556   EOSABS 0.5 08/17/2021 1556   BASOSABS 0.1 08/17/2021 1556    BMET    Component Value Date/Time   NA 136 08/17/2021 1556   K 3.7 08/17/2021 1556   CL 99 08/17/2021 1556   CO2 29 08/17/2021 1556   GLUCOSE 88 08/17/2021 1556   BUN 10 08/17/2021 1556   CREATININE 0.61 08/17/2021 1556   CALCIUM 9.9 08/17/2021 1556   GFRNONAA >60 11/10/2020 2144    BNP No results found for:  BNP  ProBNP No results found for: PROBNP  Imaging: No results found.   Assessment & Plan:   No problem-specific Assessment & Plan notes found for this encounter.     Martyn Ehrich, NP 04/12/2022

## 2022-04-13 ENCOUNTER — Ambulatory Visit (INDEPENDENT_AMBULATORY_CARE_PROVIDER_SITE_OTHER): Payer: BC Managed Care – PPO | Admitting: Primary Care

## 2022-04-13 ENCOUNTER — Ambulatory Visit (INDEPENDENT_AMBULATORY_CARE_PROVIDER_SITE_OTHER): Payer: BC Managed Care – PPO

## 2022-04-13 ENCOUNTER — Encounter: Payer: Self-pay | Admitting: Primary Care

## 2022-04-13 VITALS — BP 104/68 | HR 85 | Temp 98.2°F | Ht 63.0 in | Wt 116.8 lb

## 2022-04-13 DIAGNOSIS — R0602 Shortness of breath: Secondary | ICD-10-CM | POA: Diagnosis not present

## 2022-04-13 DIAGNOSIS — R051 Acute cough: Secondary | ICD-10-CM

## 2022-04-13 DIAGNOSIS — J471 Bronchiectasis with (acute) exacerbation: Secondary | ICD-10-CM

## 2022-04-13 DIAGNOSIS — J479 Bronchiectasis, uncomplicated: Secondary | ICD-10-CM | POA: Diagnosis not present

## 2022-04-13 DIAGNOSIS — R059 Cough, unspecified: Secondary | ICD-10-CM | POA: Diagnosis not present

## 2022-04-13 LAB — POC COVID19 BINAXNOW: SARS Coronavirus 2 Ag: NEGATIVE

## 2022-04-13 MED ORDER — PREDNISONE 10 MG PO TABS: ORAL_TABLET | ORAL | 0 refills | Status: DC

## 2022-04-13 MED ORDER — CIPROFLOXACIN HCL 500 MG PO TABS: 500.0000 mg | ORAL_TABLET | Freq: Two times a day (BID) | ORAL | 0 refills | Status: DC

## 2022-04-13 NOTE — Patient Instructions (Addendum)
Recommendations: - Start regular mucinex 1,200mg  twice daily with full glass of water for 7-10 days - Continue hypertonic nebulizer twice a day to loosen congestion until follow-up  - Use flutter valve three times a day  - Continue SYMBICORT two puffs morning and evening  RX: - Prednisone 20mg  x 5 days - Ciprofloxacin 500mg  twice daily x 10 days   Orders: - CXR today - Covid swab  Follow-up: - 10-14 days with Dr. Ander Slade or Eustaquio Maize     Prednisone Tablets What is this medication? PREDNISONE (PRED ni sone) treats many conditions such as asthma, allergic reactions, arthritis, inflammatory bowel diseases, adrenal, and blood or bone marrow disorders. It works by decreasing inflammation, slowing down an overactive immune system, or replacing cortisol normally made in the body. Cortisol is a hormone that plays an important role in how the body responds to stress, illness, and injury. It belongs to a group of medications called steroids. This medicine may be used for other purposes; ask your health care provider or pharmacist if you have questions. COMMON BRAND NAME(S): Deltasone, Predone, Sterapred, Sterapred DS What should I tell my care team before I take this medication? They need to know if you have any of these conditions: Cushing's syndrome Diabetes Glaucoma Heart disease High blood pressure Infection (especially a virus infection such as chickenpox, cold sores, or herpes) Kidney disease Liver disease Mental illness Myasthenia gravis Osteoporosis Seizures Stomach or intestine problems Thyroid disease An unusual or allergic reaction to lactose, prednisone, other medications, foods, dyes, or preservatives Pregnant or trying to get pregnant Breast-feeding How should I use this medication? Take this medication by mouth with a glass of water. Follow the directions on the prescription label. Take this medication with food. If you are taking this medication once a day, take it in the  morning. Do not take more medication than you are told to take. Do not suddenly stop taking your medication because you may develop a severe reaction. Your care team will tell you how much medication to take. If your care team wants you to stop the medication, the dose may be slowly lowered over time to avoid any side effects. Talk to your care team about the use of this medication in children. Special care may be needed. Overdosage: If you think you have taken too much of this medicine contact a poison control center or emergency room at once. NOTE: This medicine is only for you. Do not share this medicine with others. What if I miss a dose? If you miss a dose, take it as soon as you can. If it is almost time for your next dose, talk to your care team. You may need to miss a dose or take an extra dose. Do not take double or extra doses without advice. What may interact with this medication? Do not take this medication with any of the following: Metyrapone Mifepristone This medication may also interact with the following: Aminoglutethimide Amphotericin B Aspirin and aspirin-like medications Barbiturates Certain medications for diabetes, like glipizide or glyburide Cholestyramine Cholinesterase inhibitors Cyclosporine Digoxin Diuretics Ephedrine Female hormones, like estrogens and birth control pills Isoniazid Ketoconazole NSAIDS, medications for pain and inflammation, like ibuprofen or naproxen Phenytoin Rifampin Toxoids Vaccines Warfarin This list may not describe all possible interactions. Give your health care provider a list of all the medicines, herbs, non-prescription drugs, or dietary supplements you use. Also tell them if you smoke, drink alcohol, or use illegal drugs. Some items may interact with your medicine. What should  I watch for while using this medication? Visit your care team for regular checks on your progress. If you are taking this medication over a prolonged  period, carry an identification card with your name and address, the type and dose of your medication, and your care team's name and address. This medication may increase your risk of getting an infection. Tell your care team if you are around anyone with measles or chickenpox, or if you develop sores or blisters that do not heal properly. If you are going to have surgery, tell your care team that you have taken this medication within the last twelve months. Ask your care team about your diet. You may need to lower the amount of salt you eat. This medication may increase blood sugar. Ask your care team if changes in diet or medications are needed if you have diabetes. What side effects may I notice from receiving this medication? Side effects that you should report to your care team as soon as possible: Allergic reactions--skin rash, itching, hives, swelling of the face, lips, tongue, or throat Cushing syndrome--increased fat around the midsection, upper back, neck, or face, pink or purple stretch marks on the skin, thinning, fragile skin that easily bruises, unexpected hair growth High blood sugar (hyperglycemia)--increased thirst or amount of urine, unusual weakness or fatigue, blurry vision Increase in blood pressure Infection--fever, chills, cough, sore throat, wounds that don't heal, pain or trouble when passing urine, general feeling of discomfort or being unwell Low adrenal gland function--nausea, vomiting, loss of appetite, unusual weakness or fatigue, dizziness Mood and behavior changes--anxiety, nervousness, confusion, hallucinations, irritability, hostility, thoughts of suicide or self-harm, worsening mood, feelings of depression Stomach bleeding--bloody or black, tar-like stools, vomiting blood or brown material that looks like coffee grounds Swelling of the ankles, hands, or feet Side effects that usually do not require medical attention (report to your care team if they continue or are  bothersome): Acne General discomfort and fatigue Headache Increase in appetite Nausea Trouble sleeping Weight gain This list may not describe all possible side effects. Call your doctor for medical advice about side effects. You may report side effects to FDA at 1-800-FDA-1088. Where should I keep my medication? Keep out of the reach of children. Store at room temperature between 15 and 30 degrees C (59 and 86 degrees F). Protect from light. Keep container tightly closed. Throw away any unused medication after the expiration date. NOTE: This sheet is a summary. It may not cover all possible information. If you have questions about this medicine, talk to your doctor, pharmacist, or health care provider.  2023 Elsevier/Gold Standard (2021-02-06 00:00:00)

## 2022-04-13 NOTE — Progress Notes (Signed)
Please let patient know CXR showed no acute findings. COPD and bronchiectasis right lung. Well see how she is doing at fu, may want to get CT chest at that time if not better

## 2022-04-13 NOTE — Assessment & Plan Note (Signed)
Hx pseudomonas. Shortness of breath worse sine January after coming back from [place]. She developed new cough 2 days ago. She has not been regularly using mucinex or hypertonic saline nebulizer's. She is consistent with Symbicort 160 two puffs twice daily. Covid swab today was negative. Checking CXR. Sending in RX ciprofloxacin 500mg  BID x 10 days and prednisone 20mg  qd x 5 days for wheezing. Advised she resume mucinex, hypertonic saline neb and flutter vale. FU in 7-10 days or sooner if needed.   Recommendations  - Start regular mucinex 1,200mg  twice daily with full glass of water for 7-10 days - Continue hypertonic nebulizer twice a day to loosen congestion until follow-up  - Use flutter valve three times a day  - Continue SYMBICORT two puffs morning and evening  RX: - Prednisone 20mg  x 5 days - Ciprofloxacin 500mg  twice daily x 10 days   Orders: - CXR today - Covid swab  Follow-up: - 10-14 days with Dr. or 9-10

## 2022-04-15 ENCOUNTER — Telehealth: Payer: Self-pay

## 2022-04-15 NOTE — Telephone Encounter (Signed)
Called patient in response to appointment request. Patient last seen in 2022 for bronchiectasis with acute lower respiratory infection. Patient states she is starting to feels like allergies has caused some exacerbation. She is feeling short of breath at times with normal daily activities. She states she has recently been seen by her pulmonologist, PCP and tested negative for COVID. Denies any fevers or cough, shortness of breath. Started on prednisone and ciprofloxacin by pulmonology team. Patient prefers in-person visit at this time to discuss possibly restarting tobramycin. Patient agrees to appointment scheduled for tomorrow.  Routing to Dr. Luciana Axe to make aware.  Valarie Cones

## 2022-04-16 ENCOUNTER — Encounter: Payer: Self-pay | Admitting: Internal Medicine

## 2022-04-16 ENCOUNTER — Ambulatory Visit (INDEPENDENT_AMBULATORY_CARE_PROVIDER_SITE_OTHER): Payer: BC Managed Care – PPO | Admitting: Internal Medicine

## 2022-04-16 ENCOUNTER — Other Ambulatory Visit: Payer: Self-pay

## 2022-04-16 VITALS — BP 110/76 | HR 73 | Temp 98.1°F | Ht 63.0 in | Wt 118.0 lb

## 2022-04-16 DIAGNOSIS — R0609 Other forms of dyspnea: Secondary | ICD-10-CM

## 2022-04-16 DIAGNOSIS — J471 Bronchiectasis with (acute) exacerbation: Secondary | ICD-10-CM | POA: Diagnosis not present

## 2022-04-16 DIAGNOSIS — A498 Other bacterial infections of unspecified site: Secondary | ICD-10-CM | POA: Diagnosis not present

## 2022-04-16 NOTE — Assessment & Plan Note (Signed)
Ongoing issue and as above, she will discuss further treatment options, if any, with pulmonary.

## 2022-04-16 NOTE — Assessment & Plan Note (Signed)
Presently she is on appropriate treatment for an exacergbation and I have nothing to add.  She is taking mucinex and I asked her to discuss further with her pulmonary team if additional or new inhalers indicated.

## 2022-04-16 NOTE — Assessment & Plan Note (Signed)
Previous infection and coloniaztion and s/p attempts to eradicate.  Will recheck a sputum 7 days or more after she completes her current treatment.

## 2022-04-16 NOTE — Progress Notes (Signed)
   Subjective:    Patient ID: Erica Khan, female    DOB: 1981/09/07, 41 y.o.   MRN: [id number]  HPI She is here for a work in visit for acute exacerbation of bronchiectasis and wheezing.  I last saw her one year ago for Pseudomonas colonization following exacerbations and treated her with IV cefepime and inhaled tobi to attempt to eradicate the Pseudomonas.   Since last year she has had her usual exacerbations about 2 times, particularly in the winter while she was in [place].  She recently has been wheezing more and feeling more poorly with increased secretions and seen by pulmonary yesterday and started on prednisone and antibiotics.  She is concerned with the ongoing wheezing.  She takes Symbicort daily and albuterol as needed.  CXR without new opacity   Review of Systems  Constitutional:  Negative for chills and fever.  Gastrointestinal:  Negative for diarrhea and nausea.  Skin:  Negative for rash.      Objective:   Physical Exam Eyes:     General: No scleral icterus. Pulmonary:     Effort: Pulmonary effort is normal. No respiratory distress.     Breath sounds: Wheezing present.  Neurological:     Mental Status: She is alert.   SH: no tobacco       Assessment & Plan:

## 2022-04-21 ENCOUNTER — Ambulatory Visit: Payer: No Typology Code available for payment source | Admitting: Allergy

## 2022-04-28 ENCOUNTER — Ambulatory Visit: Payer: BC Managed Care – PPO | Admitting: Primary Care

## 2022-04-28 VITALS — BP 118/76 | HR 77 | Temp 97.4°F | Ht 63.0 in | Wt 118.6 lb

## 2022-04-28 DIAGNOSIS — Z8709 Personal history of other diseases of the respiratory system: Secondary | ICD-10-CM | POA: Insufficient documentation

## 2022-04-28 DIAGNOSIS — J471 Bronchiectasis with (acute) exacerbation: Secondary | ICD-10-CM | POA: Diagnosis not present

## 2022-04-28 MED ORDER — PREDNISONE 10 MG PO TABS: ORAL_TABLET | ORAL | 0 refills | Status: DC

## 2022-04-28 MED ORDER — CIPROFLOXACIN HCL 500 MG PO TABS: 500.0000 mg | ORAL_TABLET | Freq: Two times a day (BID) | ORAL | 0 refills | Status: AC

## 2022-04-28 NOTE — Progress Notes (Signed)
@Patient  ID: Erica Khan, female    DOB: 06/06/1981, 41 y.o.   MRN: [id number]  Chief Complaint  Patient presents with   Follow-up    Breathing improved. Chest not as tight. Overall better but not "normal" yet. Right side still feels tight.    Referring provider: Jarold MottoWorley, Samantha, PA  HPI: 41 year old female, never smoked. PMH significant for bronchiectasis, pseudomonas. Patient of Dr. Wynona Neatlalere, last seen in December 2022. Bronchiectatic changes to right lung noted in 2018 in [place]. 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 Completed course of treatment with tobramycin nebs and cefepime/meropenem in late 2021. Maintained on Symbicort.  Previous [facility] pulmonary encounter: 04/13/2022 Patient presents today for acute visit. She complains of shortness of breath and cough. She was in [place] from Dec-Jan 2023 for three weeks. Feels her breathing was worse when she got back. She took course of Augmentin and was feeling some better. She gets winded simply while talking or walking. She has new cough x 2 days. Sputum is white. Associated body aches, wheezing. She went on a walk outside the day before these symptoms started. She does not take antihistamine. She had not been using hypertonic saline nebulizer, she re-started this 2 days ago. She has not begun mucinex and has not been using flutter valve. She completed a second course tobramycin 7-8 months ago with ID.   Hx pseudomonas. Shortness of breath worse sine January after coming back from [place]. She developed new cough 2 days ago. She has not been regularly using mucinex or hypertonic saline nebulizer's. She is consistent with Symbicort 160 two puffs twice daily. Covid swab today was negative. Checking CXR. Sending in RX ciprofloxacin 500mg  BID x 10 days and prednisone 20mg  qd x 5 days for wheezing. Advised she resume mucinex, hypertonic saline neb and flutter vale. FU in 7-10 days or sooner if needed.   04/28/2022 Patient presents today for 10 day follow-up  for bronchiectasis with acute exacerbations. She was given 10 day course of ciprofloxacin 500mg  BID and prednisone 20mg  x 5 days. She is feeling significantly better. She still has some chest tightness on her right and some mild breathlessness. Cough has drastically improved. Cough has cleared from brown rust color to tan sputum. She is using Symbicort 160 and hypertonic saline twice daily. She is no longer tobramycin. ID has given her specimen container to collect sputum sample 10 days after completing abx.    Allergies  Allergen Reactions   Cefepime Other (See Comments)    Diffuse rash and wheezing    Immunization History  Administered Date(s) Administered   Influenza,inj,Quad PF,6+ Mos 08/13/2021   Janssen (J&J) SARS-COV-2 Vaccination 01/30/2020    Past Medical History:  Diagnosis Date   Asthma    Bronchitis     Tobacco History: Social History   Tobacco Use  Smoking Status Never  Smokeless Tobacco Never   Counseling given: Not Answered   Outpatient Medications Prior to Visit  Medication Sig Dispense Refill   albuterol (VENTOLIN HFA) 108 (90 Base) MCG/ACT inhaler Inhale 2 puffs into the lungs every 6 (six) hours as needed. (Patient not taking: Reported on 04/16/2022) 18 g 5   budesonide-formoterol (SYMBICORT) 160-4.5 MCG/ACT inhaler Inhale 2 puffs into the lungs 2 (two) times daily. 1 each 11   EPINEPHrine (AUVI-Q) 0.3 mg/0.3 mL IJ SOAJ injection Inject 0.3 mg into the muscle as needed for anaphylaxis. (Patient not taking: Reported on 04/16/2022) 1 each 1   sodium chloride 0.9 % nebulizer solution Take 3 mLs by  nebulization 2 (two) times daily as needed for wheezing. (Patient not taking: Reported on 04/16/2022) 90 mL 11   sodium chloride HYPERTONIC 3 % nebulizer solution Take by nebulization as needed for other. (Patient not taking: Reported on 04/28/2022) 750 mL 6   tobramycin, PF, (TOBI) 300 MG/5ML nebulizer solution Inhale 5 mLs (300 mg total) by nebulization in the morning and  at bedtime. For 21 days (Patient not taking: Reported on 04/16/2022) 280 mL 0   zolpidem (AMBIEN) 10 MG tablet Take 1 tablet (10 mg total) by mouth at bedtime as needed for sleep. (Patient not taking: Reported on 04/16/2022) 30 tablet 1   ciprofloxacin (CIPRO) 500 MG tablet Take 1 tablet (500 mg total) by mouth 2 (two) times daily. 20 tablet 0   predniSONE (DELTASONE) 10 MG tablet Take 2 tablets by mouth daily with breakfast for 5 days 10 tablet 0   No facility-administered medications prior to visit.   Review of Systems  Review of Systems  Constitutional: Negative.   HENT:  Positive for congestion.   Respiratory:  Positive for cough and chest tightness.   Cardiovascular: Negative.     Physical Exam  BP 118/76 (BP Location: Left Arm)   Pulse 77   Temp (!) 97.4 F (36.3 C) (Oral)   Ht 5\' 3"  (1.6 m)   Wt 118 lb 9.6 oz (53.8 kg)   SpO2 99% Comment: RA  BMI 21.01 kg/m  Physical Exam Constitutional:      Appearance: Normal appearance.  HENT:     Head: Normocephalic and atraumatic.     Right Ear: There is no impacted cerumen.     Left Ear: There is no impacted cerumen.     Mouth/Throat:     Mouth: Mucous membranes are moist.     Pharynx: Oropharynx is clear.  Cardiovascular:     Rate and Rhythm: Normal rate and regular rhythm.  Pulmonary:     Breath sounds: Wheezing and rhonchi present.  Musculoskeletal:     Cervical back: Normal range of motion and neck supple.  Skin:    General: Skin is warm and dry.  Neurological:     General: No focal deficit present.     Mental Status: She is alert and oriented to person, place, and time. Mental status is at baseline.  Psychiatric:        Mood and Affect: Mood normal.        Behavior: Behavior normal.        Thought Content: Thought content normal.        Judgment: Judgment normal.     Lab Results:  CBC    Component Value Date/Time   WBC 10.3 08/17/2021 1556   RBC 5.25 (H) 08/17/2021 1556   HGB 14.1 08/17/2021 1556   HCT  41.5 08/17/2021 1556   PLT 343.0 08/17/2021 1556   MCV 79.0 08/17/2021 1556   MCH 26.7 11/10/2020 2144   MCHC 34.0 08/17/2021 1556   RDW 13.1 08/17/2021 1556   LYMPHSABS 3.3 08/17/2021 1556   MONOABS 0.5 08/17/2021 1556   EOSABS 0.5 08/17/2021 1556   BASOSABS 0.1 08/17/2021 1556    BMET    Component Value Date/Time   NA 136 08/17/2021 1556   K 3.7 08/17/2021 1556   CL 99 08/17/2021 1556   CO2 29 08/17/2021 1556   GLUCOSE 88 08/17/2021 1556   BUN 10 08/17/2021 1556   CREATININE 0.61 08/17/2021 1556   CALCIUM 9.9 08/17/2021 1556   GFRNONAA >60 11/10/2020 2144  BNP No results found for: BNP  ProBNP No results found for: PROBNP  Imaging: DG Chest 2 View  Result Date: 04/13/2022 CLINICAL DATA:  Bronchiectasis, shortness of breath, cough EXAM: CHEST - 2 VIEW COMPARISON:  Previous studies including chest radiograph done on 11/10/2020 and CT done on 02/21/2021 FINDINGS: There is decreased volume in right lung. There is shift of mediastinum to the right. Numerous cystic spaces are noted in the right lung consistent with history of bronchiectasis. There is some improvement in aeration in the right lower lung fields. Possible small air-fluid level is seen in the 1 of the bullae in the right mid lung fields. Similar finding was seen in the previous study. There are no new infiltrates in the left lung. There is blunting of right lateral CP angle. There is possible decrease in right pleural effusion. Left lateral CP angle is clear. There is no pneumothorax. IMPRESSION: COPD. Bronchiectasis. There are no new infiltrates or signs of pulmonary edema. Possible small right pleural effusion. Electronically Signed   By: Ernie Avena M.D.   On: 04/13/2022 10:35     Assessment & Plan:   Bronchiectasis with (acute) exacerbation (HCC) - Patient has a history of Pseudomonas. She was treated for acute exacerbation of her bronchiectasis on 04/13/2022 with 10-day course of ciprofloxacin.  She is  feeling significantly better but continues to have some residual chest tightness on her right side along with some mild breathlessness.  Cough has dramatically improved but she still getting up some occasional tan mucus. We will extend Cipro an additional 5 days and prednisone 20 mg for 5 days due to wheezing. Continue to encourage patient use hypertonic saline twice daily along with Mucinex.  She would benefit from adding therapy vest to pulmonary clearance routine to prevent recurrent exacerbations. Checking HRCT scan to follow-up on progression of her bronchiectasis. FU with Dr. Wynona Neat in 2 to 4 weeks to discuss CT results and neck steps.   History of asthma - Continue Symbicort 160 2 puffs every 12 hours  Glenford Bayley, NP 04/28/2022

## 2022-04-28 NOTE — Assessment & Plan Note (Signed)
-   Continue Symbicort 160 2 puffs every 12 hours

## 2022-04-28 NOTE — Assessment & Plan Note (Addendum)
-   Patient has a history of Pseudomonas. She was treated for acute exacerbation of her bronchiectasis on 04/13/2022 with 10-day course of ciprofloxacin.  She is feeling significantly better but continues to have some residual chest tightness on her right side along with some mild breathlessness.  Cough has dramatically improved but she still getting up some occasional tan mucus. We will extend Cipro an additional 5 days and prednisone 20 mg for 5 days due to wheezing. Continue to encourage patient use hypertonic saline twice daily along with Mucinex.  She would benefit from adding therapy vest to pulmonary clearance routine to prevent recurrent exacerbations. Checking HRCT scan to follow-up on progression of her bronchiectasis. FU with Dr. Ander Slade in 2 to 4 weeks to discuss CT results and neck steps.

## 2022-04-28 NOTE — Patient Instructions (Addendum)
Recommendations: - Continue Symbicort 160 two puffs morning and evening - Continue Mucinex 600-1200mg  twice daily to loosen congestion  - Continue hypertonic saline nebulizer twice daily  Rx: - Extending ciprofloxacin additional 5 days  - Prednisone 20mg  x 5 days   Orders: - Smart vest re: bronchiectasis with recurrent exacerbations  - HRCT re: bronchiectasis   Follow-up: - First available with Dr. / middle-end of June after CT scan

## 2022-05-06 ENCOUNTER — Ambulatory Visit (HOSPITAL_BASED_OUTPATIENT_CLINIC_OR_DEPARTMENT_OTHER): Admission: RE | Admit: 2022-05-06 | Payer: BC Managed Care – PPO | Source: Ambulatory Visit

## 2022-05-13 ENCOUNTER — Ambulatory Visit: Payer: BC Managed Care – PPO | Admitting: Pulmonary Disease

## 2022-05-17 ENCOUNTER — Other Ambulatory Visit: Payer: Self-pay | Admitting: Internal Medicine

## 2022-05-17 ENCOUNTER — Ambulatory Visit (HOSPITAL_BASED_OUTPATIENT_CLINIC_OR_DEPARTMENT_OTHER): Admission: RE | Admit: 2022-05-17 | Payer: BC Managed Care – PPO | Source: Ambulatory Visit

## 2022-05-17 ENCOUNTER — Encounter: Payer: Self-pay | Admitting: Internal Medicine

## 2022-05-17 DIAGNOSIS — J471 Bronchiectasis with (acute) exacerbation: Secondary | ICD-10-CM

## 2022-05-17 NOTE — Progress Notes (Signed)
Patient request new orders for sputum culture via Mychart. Orders placed and patient made aware.  Erica Khan

## 2022-05-18 ENCOUNTER — Telehealth: Payer: Self-pay | Admitting: Pulmonary Disease

## 2022-05-18 ENCOUNTER — Other Ambulatory Visit: Payer: Self-pay

## 2022-05-18 ENCOUNTER — Ambulatory Visit (HOSPITAL_BASED_OUTPATIENT_CLINIC_OR_DEPARTMENT_OTHER): Admission: RE | Admit: 2022-05-18 | Payer: BC Managed Care – PPO | Source: Ambulatory Visit

## 2022-05-18 DIAGNOSIS — J471 Bronchiectasis with (acute) exacerbation: Secondary | ICD-10-CM

## 2022-05-19 ENCOUNTER — Other Ambulatory Visit: Payer: BC Managed Care – PPO

## 2022-05-19 ENCOUNTER — Other Ambulatory Visit: Payer: Self-pay

## 2022-05-19 DIAGNOSIS — J471 Bronchiectasis with (acute) exacerbation: Secondary | ICD-10-CM

## 2022-05-19 DIAGNOSIS — R0609 Other forms of dyspnea: Secondary | ICD-10-CM | POA: Diagnosis not present

## 2022-05-19 DIAGNOSIS — A498 Other bacterial infections of unspecified site: Secondary | ICD-10-CM | POA: Diagnosis not present

## 2022-05-20 ENCOUNTER — Ambulatory Visit (INDEPENDENT_AMBULATORY_CARE_PROVIDER_SITE_OTHER): Payer: BC Managed Care – PPO

## 2022-05-20 ENCOUNTER — Ambulatory Visit: Payer: BC Managed Care – PPO | Admitting: Nurse Practitioner

## 2022-05-20 ENCOUNTER — Encounter: Payer: Self-pay | Admitting: Nurse Practitioner

## 2022-05-20 VITALS — BP 102/74 | HR 102 | Temp 98.4°F | Ht 63.0 in | Wt 112.0 lb

## 2022-05-20 DIAGNOSIS — J471 Bronchiectasis with (acute) exacerbation: Secondary | ICD-10-CM

## 2022-05-20 DIAGNOSIS — Z8709 Personal history of other diseases of the respiratory system: Secondary | ICD-10-CM

## 2022-05-20 DIAGNOSIS — A498 Other bacterial infections of unspecified site: Secondary | ICD-10-CM

## 2022-05-20 DIAGNOSIS — R059 Cough, unspecified: Secondary | ICD-10-CM | POA: Diagnosis not present

## 2022-05-20 MED ORDER — METHYLPREDNISOLONE ACETATE 80 MG/ML IJ SUSP
80.0000 mg | Freq: Once | INTRAMUSCULAR | Status: AC
  Administered 2022-05-20: 80 mg via INTRAMUSCULAR

## 2022-05-20 MED ORDER — PREDNISONE 10 MG PO TABS: ORAL_TABLET | ORAL | 0 refills | Status: DC

## 2022-05-20 NOTE — Progress Notes (Signed)
@Patient  ID: , female    DOB: 1981-10-02, 41 y.o.   MRN: [id number]  Chief Complaint  Patient presents with   Follow-up    Referring provider: [id number], PA  HPI: 41 year old female, never smoker followed for bronchiectasis, hx of pseudomonas colonization, asthma. She is a patient of Dr. 41 and last seen in office 04/28/2022 by 06/28/2022. She was also followed by ID for pseudomonal infection and treated with tobramycin nebs and cefepime/meropenem in late 2021. No other significant medical history.  TEST/EVENTS:  02/21/2021 CT chest without contrast: There is extensive architectural distortion of the right lung with essentially complete loss of normal right lung parenchyma.  Appears stable when compared to prior.  There is persistent bronchiectasis and bullous changes in the left upper lobe.  There is also extensive pleural-parenchymal scarring at the left lung apex which is also stable.  04/13/2022: 04/15/2022 with Sudie Grumbling NP.  She was in [place] from December to January 2023 for 3 weeks and when she got back she felt like her breathing was worse.  She took a course of Augmentin and was feeling some better.  She then presented at this OV with increased shortness of breath and productive cough.  Sputum was primarily white and transitioned to brown/rust color.  COVID swab was negative.  She was treated with 10-day course of Cipro and prednisone burst.  She was advised to restart mucociliary clearance therapies.  Close follow-up  04/28/2022: OV with 06/28/2022 NP for follow-up after being treated for bronchiectasis exacerbation.  She reported feeling significantly better.  Still having some chest tightness on the right side with mild breathlessness but cough had drastically improved.  Cipro course was extended for an additional 5 days.  Prednisone was also extended for 20 mg for 5 days.  Continue mucociliary therapy.  She saw ID in the interim who provided her with sputum cup to collect culture 10  days after completing antibiotic therapy.  HRCT ordered for follow-up of bronchiectasis.  Continued on Symbicort for asthma.  05/20/2022: Today-follow-up Patient presents today for follow-up however, she is having acute symptoms.  She was feeling better after her last visit and had felt back to her baseline.  Then last week, she went to an exercise class and developed worsening shortness of breath and productive cough afterwards.  She also noticed that she was wheezing a lot more.  She had intermittent fevers and chills.  She is also felt significantly more fatigued.  Today, she reports that she is feeling a little bit better than she was.  Her sputum was previously green and has now begun to lighten up a little bit and is closer to a cream color.  She has not had any more fevers.  She does still feel like she is a little bit more winded than she normally is and is wheezing more so than normal.  She denies any hemoptysis, night sweats, anorexia, recent weight loss.  She continues on Symbicort.  She is using her flutter valve and Mucinex.  Does not do her hypertonic saline nebs.  She did collect the sputum culture yesterday that was ordered by ID; currently awaiting results.  She has yet to have her HRCT due to insurance issues.  She has been provided with a list of approved facilities to get this done out and plans to schedule this.  Allergies  Allergen Reactions   Cefepime Other (See Comments)    Diffuse rash and wheezing    Immunization History  Administered Date(s) Administered   Influenza,inj,Quad PF,6+ Mos 08/13/2021   Janssen (J&J) SARS-COV-2 Vaccination 01/30/2020    Past Medical History:  Diagnosis Date   Asthma    Bronchitis     Tobacco History: Social History   Tobacco Use  Smoking Status Never  Smokeless Tobacco Never   Counseling given: Not Answered   Outpatient Medications Prior to Visit  Medication Sig Dispense Refill   albuterol (VENTOLIN HFA) 108 (90 Base) MCG/ACT  inhaler Inhale 2 puffs into the lungs every 6 (six) hours as needed. (Patient not taking: Reported on 04/16/2022) 18 g 5   budesonide-formoterol (SYMBICORT) 160-4.5 MCG/ACT inhaler Inhale 2 puffs into the lungs 2 (two) times daily. 1 each 11   EPINEPHrine (AUVI-Q) 0.3 mg/0.3 mL IJ SOAJ injection Inject 0.3 mg into the muscle as needed for anaphylaxis. (Patient not taking: Reported on 04/16/2022) 1 each 1   sodium chloride 0.9 % nebulizer solution Take 3 mLs by nebulization 2 (two) times daily as needed for wheezing. (Patient not taking: Reported on 04/16/2022) 90 mL 11   sodium chloride HYPERTONIC 3 % nebulizer solution Take by nebulization as needed for other. (Patient not taking: Reported on 04/28/2022) 750 mL 6   zolpidem (AMBIEN) 10 MG tablet Take 1 tablet (10 mg total) by mouth at bedtime as needed for sleep. (Patient not taking: Reported on 04/16/2022) 30 tablet 1   predniSONE (DELTASONE) 10 MG tablet Take 2 tablets daily x 5 days 10 tablet 0   No facility-administered medications prior to visit.     Review of Systems:   Constitutional: No weight loss or gain, night sweats. +fevers (resolved), chills (resolved), fatigue (improving). HEENT: No headaches, difficulty swallowing, tooth/dental problems, or sore throat. No sneezing, itching, ear ache, nasal congestion, or post nasal drip CV:  No chest pain, orthopnea, PND, swelling in lower extremities, anasarca, dizziness, palpitations, syncope Resp: +shortness of breath with exertion; wheezing; productive cough. No hemoptysis. No chest wall deformity GI:  No heartburn, indigestion, abdominal pain, nausea, vomiting, diarrhea, change in bowel habits, loss of appetite, bloody stools.  Skin: No rash, lesions, ulcerations MSK:  No joint pain or swelling.  No decreased range of motion.  No back pain. Neuro: No dizziness or lightheadedness.  Psych: No depression or anxiety. Mood stable.     Physical Exam:  BP 102/74 (BP Location: Right Arm, Patient  Position: Sitting, Cuff Size: Normal)   Pulse (!) 102   Temp 98.4 F (36.9 C) (Oral)   Ht 5\' 3"  (1.6 m)   Wt 112 lb (50.8 kg)   SpO2 97%   BMI 19.84 kg/m   GEN: Pleasant, interactive, well-appearing; in no acute distress. HEENT:  Normocephalic and atraumatic. PERRLA. Sclera white. Nasal turbinates pink, moist and patent bilaterally. No rhinorrhea present. Oropharynx pink and moist, without exudate or edema. No lesions, ulcerations, or postnasal drip.  NECK:  Supple w/ fair ROM. No JVD present. CV: RRR, no m/r/g, no peripheral edema. Pulses intact, +2 bilaterally. No cyanosis, pallor or clubbing. PULMONARY:  Unlabored, regular breathing.  Scattered wheezes bilaterally A&P. No accessory muscle use. No dullness to percussion. GI: BS present and normoactive. Soft, non-tender to palpation. MSK: No erythema, warmth or tenderness. Cap refil <2 sec all extrem. No deformities or joint swelling noted.  Neuro: A/Ox3. No focal deficits noted.   Skin: Warm, no lesions or rashe Psych: Normal affect and behavior. Judgement and thought content appropriate.     Lab Results:  CBC    Component Value Date/Time  WBC 10.3 08/17/2021 1556   RBC 5.25 (H) 08/17/2021 1556   HGB 14.1 08/17/2021 1556   HCT 41.5 08/17/2021 1556   PLT 343.0 08/17/2021 1556   MCV 79.0 08/17/2021 1556   MCH 26.7 11/10/2020 2144   MCHC 34.0 08/17/2021 1556   RDW 13.1 08/17/2021 1556   LYMPHSABS 3.3 08/17/2021 1556   MONOABS 0.5 08/17/2021 1556   EOSABS 0.5 08/17/2021 1556   BASOSABS 0.1 08/17/2021 1556    BMET    Component Value Date/Time   NA 136 08/17/2021 1556   K 3.7 08/17/2021 1556   CL 99 08/17/2021 1556   CO2 29 08/17/2021 1556   GLUCOSE 88 08/17/2021 1556   BUN 10 08/17/2021 1556   CREATININE 0.61 08/17/2021 1556   CALCIUM 9.9 08/17/2021 1556   GFRNONAA >60 11/10/2020 2144    BNP No results found for: "BNP"   Imaging:  DG Chest 2 View  Result Date: 05/20/2022 CLINICAL DATA:  Cough. EXAM:  CHEST - 2 VIEW COMPARISON:  Chest radiograph dated 04/13/2022. CT dated 02/21/2021. FINDINGS: Similar appearance of lungs with architectural distortion of the right lung parenchyma, cystic changes, and decreased right lung volume with shift of the mediastinum into the right hemithorax. No new consolidation. No large pleural effusion or pneumothorax. No acute osseous pathology. IMPRESSION: 1. No acute cardiopulmonary process. 2. Stable chronic changes of the right lung. Electronically Signed   By: Elgie Collard M.D.   On: 05/20/2022 12:42    methylPREDNISolone acetate (DEPO-MEDROL) injection 80 mg     Date Action Dose Route User   05/20/2022 1303 Given 80 mg Intramuscular (Right Upper Outer Quadrant) Mabe, Willeen Niece, CMA           No data to display          No results found for: "NITRICOXIDE"      Assessment & Plan:   Bronchiectasis with (acute) exacerbation (HCC) Bronchiectatic flare.  She has been having recurrent exacerbations recently; most recently completed a 15-day course of cipro around 2 weeks ago.  Given that her symptoms are slowly improving and cough is clearing some, will hold off on any further antibiotic therapy at this point unless there is evidence of superimposed infection on CXR.  We will await sputum culture.  Recommended we also collect AFB, which she will return.  With her bronchospasm, will treat with Depo injection 80 mg x 1 and prednisone taper.  Continue mucociliary clearance therapies.  She has had multiple flares requiring antibiotic courses over the last 6 months despite using flutter so we will work on getting her vest therapy.  Follow-up with ID as planned.  She is going to work on getting her HRCT scheduled at an approved facility.  Patient Instructions  Continue Symbicort 2 puffs Twice daily. Brush tongue and rinse mouth afterwards  Continue Albuterol inhaler 2 puffs every 6 hours as needed for shortness of breath or wheezing. Notify if symptoms persist  despite rescue inhaler/neb use. Restart hypertonic saline nebs Twice daily for cough/congestion Continue mucinex 1200 mg Twice daily for chest congestion/cough  Prednisone taper. 4 tabs for 2 days, then 3 tabs for 2 days, 2 tabs for 2 days, then 1 tab for 2 days, then stop. Take in AM with food. Start vest therapy 2 times a day after neb treatments   Complete HRCT as previously scheduled  Return cultures  Follow up with Dr. Wynona Neat on 7/19 as previously scheduled. If symptoms do not improve or worsen, please contact office for  sooner follow up or seek emergency care.    History of asthma She does have a history of asthma and is on Symbicort.  Bronchospasm on exam today.  See above plan.  Pseudomonas aeruginosa infection Completed tobramycin nebs 7 to 8 months ago.  She is currently followed by ID.  Completed 15-day course of ciprofloxacin for bronchiectatic exacerbation a few weeks ago.  Sputum culture was collected yesterday.  We will follow-up on this.   I spent 35 minutes of dedicated to the care of this patient on the date of this encounter to include pre-visit review of records, face-to-face time with the patient discussing conditions above, post visit ordering of testing, clinical documentation with the electronic health record, making appropriate referrals as documented, and communicating necessary findings to members of the patients care team.  Noemi Chapel, NP 05/20/2022  Pt aware and understands NP's role.

## 2022-05-20 NOTE — Assessment & Plan Note (Addendum)
She does have a history of asthma and is on Symbicort.  Bronchospasm on exam today.  See above plan.

## 2022-05-20 NOTE — Assessment & Plan Note (Signed)
Completed tobramycin nebs 7 to 8 months ago.  She is currently followed by ID.  Completed 15-day course of ciprofloxacin for bronchiectatic exacerbation a few weeks ago.  Sputum culture was collected yesterday.  We will follow-up on this.

## 2022-05-20 NOTE — Assessment & Plan Note (Addendum)
Bronchiectatic flare.  She has been having recurrent exacerbations recently; most recently completed a 15-day course of cipro around 2 weeks ago.  Given that her symptoms are slowly improving and cough is clearing some, will hold off on any further antibiotic therapy at this point unless there is evidence of superimposed infection on CXR.  We will await sputum culture.  Recommended we also collect AFB, which she will return.  With her bronchospasm, will treat with Depo injection 80 mg x 1 and prednisone taper.  Continue mucociliary clearance therapies.  She has had multiple flares requiring antibiotic courses over the last 6 months despite using flutter so we will work on getting her vest therapy.  Follow-up with ID as planned.  She is going to work on getting her HRCT scheduled at an approved facility.  Patient Instructions  Continue Symbicort 2 puffs Twice daily. Brush tongue and rinse mouth afterwards  Continue Albuterol inhaler 2 puffs every 6 hours as needed for shortness of breath or wheezing. Notify if symptoms persist despite rescue inhaler/neb use. Restart hypertonic saline nebs Twice daily for cough/congestion Continue mucinex 1200 mg Twice daily for chest congestion/cough  Prednisone taper. 4 tabs for 2 days, then 3 tabs for 2 days, 2 tabs for 2 days, then 1 tab for 2 days, then stop. Take in AM with food. Start vest therapy 2 times a day after neb treatments   Complete HRCT as previously scheduled  Return cultures  Follow up with Dr. Wynona Neat on 7/19 as previously scheduled. If symptoms do not improve or worsen, please contact office for sooner follow up or seek emergency care.

## 2022-05-20 NOTE — Patient Instructions (Addendum)
Continue Symbicort 2 puffs Twice daily. Brush tongue and rinse mouth afterwards  Continue Albuterol inhaler 2 puffs every 6 hours as needed for shortness of breath or wheezing. Notify if symptoms persist despite rescue inhaler/neb use. Restart hypertonic saline nebs Twice daily for cough/congestion Continue mucinex 1200 mg Twice daily for chest congestion/cough  Prednisone taper. 4 tabs for 2 days, then 3 tabs for 2 days, 2 tabs for 2 days, then 1 tab for 2 days, then stop. Take in AM with food. Start vest therapy 2 times a day after neb treatments   Complete HRCT as previously scheduled  Return cultures  Follow up with Dr. Wynona Neat on 7/19 as previously scheduled. If symptoms do not improve or worsen, please contact office for sooner follow up or seek emergency care.

## 2022-05-20 NOTE — Telephone Encounter (Signed)
Pt was seen by [facility] for a visit today 6/29 and pt had a cxr while at the visit. Nothing further needed.

## 2022-05-21 ENCOUNTER — Other Ambulatory Visit: Payer: BC Managed Care – PPO

## 2022-05-21 DIAGNOSIS — J471 Bronchiectasis with (acute) exacerbation: Secondary | ICD-10-CM

## 2022-05-22 LAB — RESPIRATORY CULTURE OR RESPIRATORY AND SPUTUM CULTURE
MICRO NUMBER:: 13587905
RESULT:: NORMAL
SPECIMEN QUALITY:: ADEQUATE

## 2022-05-26 ENCOUNTER — Other Ambulatory Visit: Payer: BC Managed Care – PPO

## 2022-05-31 ENCOUNTER — Encounter: Payer: Self-pay | Admitting: Internal Medicine

## 2022-06-09 ENCOUNTER — Encounter: Payer: Self-pay | Admitting: Pulmonary Disease

## 2022-06-09 ENCOUNTER — Ambulatory Visit: Payer: BC Managed Care – PPO | Admitting: Pulmonary Disease

## 2022-06-09 VITALS — BP 110/68 | HR 83 | Temp 97.9°F | Ht 63.0 in | Wt 112.8 lb

## 2022-06-09 DIAGNOSIS — J479 Bronchiectasis, uncomplicated: Secondary | ICD-10-CM | POA: Diagnosis not present

## 2022-06-09 NOTE — Patient Instructions (Addendum)
Sputum for Gram stain and cultures-if symptoms are changing -Place an order for sputum Gram stain and cultures, provide sputum cup -This is only to be done with worsening symptoms  Continue nebulization treatments Saline nebulization treatments as needed  Vest therapy as needed  Call us/send [place] a message with any significant concerns  Follow-up with the CT  I will see you in 3 months

## 2022-06-09 NOTE — Progress Notes (Signed)
Erica Khan    [id number]    01-01-1981  Primary Care Physician:Worley, Lelon Mast, PA  Referring Physician: Jarold Motto, PA [address] [place],  [place] [address]  Chief complaint:   Did have a recent exacerbation  HPI: Was recently seen in the office by Rhunette Croft Recent visit with ID  Most recent sputum sample did show gram-positive cocci in chains  Did have significant symptoms prior to the last sputum sample but symptoms have since stabilized Does bring up thin greenish phlegm, able to expectorate  She has a CT scan pending Prescription for vest therapy also pending  Mucinex can be used as needed to thin secretions Hypertonic saline nebulizations to be used once or twice a day as needed  Continue Symbicort use  Has had multiple courses of antibiotics Continues to follow-up with infectious disease as well  Late 2021, completed a course of treatment with tobramycin and cefepime/meropenem -Significant improvement in symptoms following treatment with tobramycin in the past  She ended up at urgent care where she was treated with a course of doxycycline recently following an exacerbation Was called in a prescription for tobramycin following last visit, started to feel better soon after, so has not used the tobramycin  She has not been using the flutter device or hypertonic saline nebulizations  She last followed up with Dr.Comer, In January  Historically Chronic lung infection started at age 41 with bronchiectasis right lung Recent CT did show some involvement of the left  Uses nebulization treatments-uses hypertonic saline, has not been religious with its use Uses Mucinex intermittently  Since the cold weather she has had more symptoms, no fevers, no chills Not feeling acutely ill  She does have wheezing-Symbicort helps  She does have a regular cough, increased sputum the last few days  Has shortness of breath with wheezing Occasional  throat discomfort  Was evaluated about 2018 in [place] with a chest x-ray and CT scan which I reviewed with the patient showing extensive bronchiectatic changes with completely destroyed right lung  She has had bronchoscopies at least twice in [place], does not recollect any organism being cultured  Current culture did reveal Pseudomonas before treatment with antibiotics-tobramycin  Outpatient Encounter Medications as of 06/09/2022  Medication Sig   budesonide-formoterol (SYMBICORT) 160-4.5 MCG/ACT inhaler Inhale 2 puffs into the lungs 2 (two) times daily.   sodium chloride 0.9 % nebulizer solution Take 3 mLs by nebulization 2 (two) times daily as needed for wheezing.   sodium chloride HYPERTONIC 3 % nebulizer solution Take by nebulization as needed for other.   [DISCONTINUED] albuterol (VENTOLIN HFA) 108 (90 Base) MCG/ACT inhaler Inhale 2 puffs into the lungs every 6 (six) hours as needed. (Patient not taking: Reported on 04/16/2022)   [DISCONTINUED] EPINEPHrine (AUVI-Q) 0.3 mg/0.3 mL IJ SOAJ injection Inject 0.3 mg into the muscle as needed for anaphylaxis. (Patient not taking: Reported on 04/16/2022)   [DISCONTINUED] predniSONE (DELTASONE) 10 MG tablet 4 tabs for 2 days, then 3 tabs for 2 days, 2 tabs for 2 days, then 1 tab for 2 days, then stop (Patient not taking: Reported on 06/09/2022)   [DISCONTINUED] zolpidem (AMBIEN) 10 MG tablet Take 1 tablet (10 mg total) by mouth at bedtime as needed for sleep. (Patient not taking: Reported on 04/16/2022)   No facility-administered encounter medications on file as of 06/09/2022.    Allergies as of 06/09/2022 - Review Complete 06/09/2022  Allergen Reaction Noted   Cefepime Other (See Comments)  11/08/2020    Past Medical History:  Diagnosis Date   Asthma    Bronchitis     Past Surgical History:  Procedure Laterality Date   BRONCHOSCOPY N/A    over seven years ago   CESAREAN SECTION  2011    Family History  Problem Relation Age of Onset    Osteoarthritis Mother    Cancer Neg Hx     Social History   Socioeconomic History   Marital status: Single    Spouse name: Not on file   Number of children: Not on file   Years of education: Not on file   Highest education level: Not on file  Occupational History   Not on file  Tobacco Use   Smoking status: Never   Smokeless tobacco: Never  Vaping Use   Vaping Use: Never used  Substance and Sexual Activity   Alcohol use: Yes    Comment: occasional   Drug use: Never   Sexual activity: Yes    Birth control/protection: Condom  Other Topics Concern   Not on file  Social History Narrative   From [place]   Currently at [employer] to [place] at end of 2020   Social Determinants of Corporate investment banker Strain: Not on file  Food Insecurity: Not on file  Transportation Needs: Not on file  Physical Activity: Not on file  Stress: Not on file  Social Connections: Not on file  Intimate Partner Violence: Not on file    Review of Systems  Constitutional:  Positive for fatigue. Negative for fever.  HENT:  Positive for sinus pain.   Respiratory:  Positive for cough and shortness of breath.   Cardiovascular: Negative.     Vitals:   06/09/22 1510  BP: 110/68  Pulse: 83  Temp: 97.9 F (36.6 C)  SpO2: 98%     Physical Exam Constitutional:      Appearance: Normal appearance.  HENT:     Head: Normocephalic.     Nose: No congestion or rhinorrhea.     Mouth/Throat:     Pharynx: No oropharyngeal exudate or posterior oropharyngeal erythema.  Cardiovascular:     Rate and Rhythm: Normal rate and regular rhythm.     Pulses: Normal pulses.     Heart sounds: Normal heart sounds. No murmur heard.    No friction rub.  Pulmonary:     Effort: Pulmonary effort is normal. No respiratory distress.     Breath sounds: No stridor. Rhonchi present. No wheezing.  Musculoskeletal:     Cervical back: No rigidity or tenderness.  Neurological:     Mental Status: She is alert.   Psychiatric:        Mood and Affect: Mood normal.    Data Reviewed:  2021 shows improvement in endobronchial collection  Last CT chest was 02/21/2021-loss of right lung parenchyma  Assessment:  Bronchiectasis - recent exacerbation treated with a course of antibiotics  Shortness of breath  Constant sputum production No acute infectious process at present  Bronchiectasis  History of asthma  .  Continue Symbicort for asthma  For mucus clearance -Nebulization with hypertonic saline -Flutter device use   Plan/Recommendations: .  Continue Symbicort .  Antibiotics as needed .  Obtain CT scan of the chest .  Follow-up in about 3 months .  Start vest therapy  stable  .  We will place order for sputum Gram stain and cultures, only needs to be done if significant exacerbation.  Encouraged to  call with any significant concerns   Virl Diamond MD [facility] 06/09/2022, 3:17 PM  CC: Jarold Motto, PA

## 2022-06-09 NOTE — Progress Notes (Signed)
She wants to get a sputum sample at her infectious disease provider and will drop the cup off at their office. Provider aware.

## 2022-06-10 ENCOUNTER — Telehealth: Payer: Self-pay | Admitting: Pulmonary Disease

## 2022-06-10 NOTE — Telephone Encounter (Signed)
We don't do the authorizations for the DME companies they get there own, so whoever they have that gets them would need to do the peer to peer.

## 2022-06-10 NOTE — Telephone Encounter (Signed)
Ladies,   Per [employer]:  [employer] states that a peer to peer is required as the pts insurance [employer]bs has denied the pts ''vest''  [id number]  reference # [id number]    Was not sure who to send this to.

## 2022-06-16 ENCOUNTER — Encounter: Payer: Self-pay | Admitting: Physician Assistant

## 2022-07-05 ENCOUNTER — Ambulatory Visit: Payer: BC Managed Care – PPO | Admitting: Physician Assistant

## 2022-07-05 ENCOUNTER — Encounter: Payer: Self-pay | Admitting: Physician Assistant

## 2022-07-05 VITALS — BP 102/69 | HR 74 | Temp 98.1°F | Ht 63.0 in | Wt 111.4 lb

## 2022-07-05 DIAGNOSIS — Z30011 Encounter for initial prescription of contraceptive pills: Secondary | ICD-10-CM | POA: Diagnosis not present

## 2022-07-05 DIAGNOSIS — B079 Viral wart, unspecified: Secondary | ICD-10-CM

## 2022-07-05 MED ORDER — NORETHIN ACE-ETH ESTRAD-FE 1-20 MG-MCG PO TABS: 1.0000 | ORAL_TABLET | Freq: Every day | ORAL | 11 refills | Status: DC

## 2022-07-05 NOTE — Patient Instructions (Signed)
It was great to see you!  Ask your insurance about HPV vaccine  Return in as soon as two weeks for another freezing treatment of your wart  Start birth control and take consistently Use condoms for first week to prevent pregnancy  Take care,  Jarold Motto PA-C

## 2022-07-05 NOTE — Progress Notes (Addendum)
Erica Khan is a 41 y.o. female here for a new problem.  History of Present Illness:   Chief Complaint  Patient presents with  . Contraception    HPI  OCP initiation She wants to start birth control pills. She is in a monogamous relationship and wants to prevent pregnancy. Denies: smoking use, elevated blood pressure, hx of clotting.  Wart She has a large wart on her left hand. She went to [place] and it was cut off and then it returned. She is wondering what her options for treatment are.   Past Medical History:  Diagnosis Date  . Asthma   . Bronchitis      Social History   Tobacco Use  . Smoking status: Never  . Smokeless tobacco: Never  Vaping Use  . Vaping Use: Never used  Substance Use Topics  . Alcohol use: Yes    Comment: occasional  . Drug use: Never    Past Surgical History:  Procedure Laterality Date  . BRONCHOSCOPY N/A    over seven years ago  . CESAREAN SECTION  2011    Family History  Problem Relation Age of Onset  . Osteoarthritis Mother   . Cancer Neg Hx     Allergies  Allergen Reactions  . Cefepime Other (See Comments)    Diffuse rash and wheezing    Current Medications:   Current Outpatient Medications:  .  budesonide-formoterol (SYMBICORT) 160-4.5 MCG/ACT inhaler, Inhale 2 puffs into the lungs 2 (two) times daily., Disp: 1 each, Rfl: 11 .  sodium chloride 0.9 % nebulizer solution, Take 3 mLs by nebulization 2 (two) times daily as needed for wheezing., Disp: 90 mL, Rfl: 11 .  sodium chloride HYPERTONIC 3 % nebulizer solution, Take by nebulization as needed for other., Disp: 750 mL, Rfl: 6   Review of Systems:   ROS Negative unless otherwise specified per HPI.  Vitals:   Vitals:   07/05/22 0857  BP: 102/69  Pulse: 74  Temp: 98.1 F (36.7 C)  SpO2: 97%  Weight: 111 lb 6.4 oz (50.5 kg)  Height: 5\' 3"  (1.6 m)     Body mass index is 19.73 kg/m.  Physical Exam:   Physical Exam Vitals and nursing note reviewed.   Constitutional:      General: She is not in acute distress.    Appearance: She is well-developed. She is not ill-appearing or toxic-appearing.  Cardiovascular:     Rate and Rhythm: Normal rate and regular rhythm.     Pulses: Normal pulses.     Heart sounds: Normal heart sounds, S1 normal and S2 normal.  Pulmonary:     Effort: Pulmonary effort is normal.     Breath sounds: Normal breath sounds.  Skin:    General: Skin is warm and dry.     Comments: Wart on dorsal aspect of R hand   Neurological:     Mental Status: She is alert.     GCS: GCS eye subscore is 4. GCS verbal subscore is 5. GCS motor subscore is 6.  Psychiatric:        Speech: Speech normal.        Behavior: Behavior normal. Behavior is cooperative.    Procedure: Cryotherapy  Consent:  Risks and benefits of therapy discussed with patient who voices understanding and agrees with planned care. No barriers to communication or understanding identified.  After obtaining informed consent, the patient's identity, procedure, and site were verified during a pause prior to proceeding with the minor surgical procedure  as per universal protocol recommendations.    Meds, vitals, and allergies reviewed.  After appropriate cleansing, liquid nitrogen was applied to wart on patient's right hand. Patient tolerated procedure well without any complications.   Assessment and Plan:   OCP (oral contraceptive pills) initiation No red flags or CI's on discussion Side effects, risks, benefits discussed Start lo-loestrin Follow-up in 1 year Denies concerns for pregnancy  Viral warts, unspecified type Cryo completed Tolerated well Will refer to dermatology due to size of wart May return in 2 weeks for additional treatment as needed Aftercare, including blister formation, risks of bleeding, and risks of recurrence were discussed. All questions answered.  Return for retreatment as needed for further evaluation and management.      Jarold Motto, PA-C

## 2022-07-13 ENCOUNTER — Encounter: Payer: Self-pay | Admitting: Pulmonary Disease

## 2022-07-13 NOTE — Telephone Encounter (Signed)
Pharmacy, per pt's message can you look into covered alternatives to symbicort for this pt? Thanks so much!!

## 2022-07-14 ENCOUNTER — Other Ambulatory Visit (HOSPITAL_COMMUNITY): Payer: Self-pay

## 2022-07-14 NOTE — Telephone Encounter (Signed)
Dr. Val Eagle, please see mychart messages sent by pt and also see below from prior auth team about covered alternatives.  Delsa Bern, CPhT  Sheran Luz, RN 2 hours ago (8:37 AM)    Test billing on this patients insurance returns the following pricing for ICS/LABA products:   Advair Diskus (DAW 9) $88.31  Advair HFA $125  Breo $125  Dulera non-formulary  Symbicort non-formulary

## 2022-07-16 ENCOUNTER — Encounter: Payer: Self-pay | Admitting: Pulmonary Disease

## 2022-07-16 ENCOUNTER — Other Ambulatory Visit: Payer: Self-pay | Admitting: Pulmonary Disease

## 2022-07-16 MED ORDER — FLUTICASONE-SALMETEROL 230-21 MCG/ACT IN AERO: 2.0000 | INHALATION_SPRAY | Freq: Two times a day (BID) | RESPIRATORY_TRACT | 12 refills | Status: DC

## 2022-07-16 NOTE — Telephone Encounter (Signed)
Dr. Wynona Neat, Please see patient message regarding Symbicort inhaler and a comparable inhaler.  Please advise.  Thank you.

## 2022-07-16 NOTE — Telephone Encounter (Signed)
Advair HFA will be the one that is most similar to Symbicort that she was used to using  I will place order for Advair HFA  The cost for the inhalers for covered alternatives include  Advair discus-$88 Advair HFA 125 Breo 125

## 2022-07-19 ENCOUNTER — Other Ambulatory Visit: Payer: Self-pay | Admitting: *Deleted

## 2022-07-19 MED ORDER — SODIUM CHLORIDE 3 % IN NEBU: INHALATION_SOLUTION | RESPIRATORY_TRACT | 6 refills | Status: DC | PRN

## 2022-07-19 MED ORDER — FLUTICASONE-SALMETEROL 230-21 MCG/ACT IN AERO: 2.0000 | INHALATION_SPRAY | Freq: Two times a day (BID) | RESPIRATORY_TRACT | 12 refills | Status: DC

## 2022-07-27 ENCOUNTER — Ambulatory Visit: Payer: BC Managed Care – PPO | Admitting: Physician Assistant

## 2022-07-27 NOTE — Telephone Encounter (Signed)
Can we assist the patient with coupons if we have any?  Advair should not be inferior to Symbicort Hypertonic saline nebulizations should help thin secretions Mucinex  N-acetylcysteine 600 mg twice daily may also help clear secretions-it is over-the-counter

## 2022-07-28 ENCOUNTER — Other Ambulatory Visit: Payer: Self-pay | Admitting: Pulmonary Disease

## 2022-07-28 MED ORDER — BENZONATATE 200 MG PO CAPS: 200.0000 mg | ORAL_CAPSULE | Freq: Three times a day (TID) | ORAL | 1 refills | Status: DC | PRN

## 2022-08-12 LAB — AFB CULTURE WITH SMEAR (NOT AT ARMC)
Acid Fast Culture: NEGATIVE
Acid Fast Smear: NEGATIVE

## 2022-08-16 ENCOUNTER — Encounter: Payer: Self-pay | Admitting: *Deleted

## 2022-09-09 ENCOUNTER — Ambulatory Visit: Payer: BC Managed Care – PPO | Admitting: Pulmonary Disease

## 2022-09-20 ENCOUNTER — Ambulatory Visit: Payer: 59 | Admitting: Pulmonary Disease

## 2022-09-20 ENCOUNTER — Encounter: Payer: Self-pay | Admitting: Pulmonary Disease

## 2022-09-20 VITALS — BP 104/68 | HR 77 | Ht 63.0 in | Wt 111.8 lb

## 2022-09-20 DIAGNOSIS — J479 Bronchiectasis, uncomplicated: Secondary | ICD-10-CM

## 2022-09-20 MED ORDER — AMOXICILLIN-POT CLAVULANATE 875-125 MG PO TABS: 1.0000 | ORAL_TABLET | Freq: Two times a day (BID) | ORAL | 0 refills | Status: AC

## 2022-09-20 MED ORDER — ALBUTEROL SULFATE HFA 108 (90 BASE) MCG/ACT IN AERS: 2.0000 | INHALATION_SPRAY | RESPIRATORY_TRACT | 11 refills | Status: AC | PRN

## 2022-09-20 NOTE — Patient Instructions (Signed)
Follow-up appointment in about 3 months  We will be glad to see you sooner if you are having problems  Prescription for Augmentin sent to pharmacy for you  Prescription for albuterol to be used as needed  Call with significant concerns  You may consider N-acetylcysteine 600 mg p.o. twice daily-may help sputum clearance

## 2022-09-20 NOTE — Progress Notes (Unsigned)
Erica Khan    [id number]    1981-10-05  Primary Care Physician:Worley, Aldona Bar, PA  Referring Physician: Inda Coke, [address] [place],  [place] [address]  Chief complaint:   Follow-up for bronchiectasis  HPI: Denies any significant symptoms This is usually a good period of a year for until winter months  She does saline nebulization once a day Uses Advair twice a day  Has been doing relatively well  Does bring up thin clear phlegm at present, able to clear secretions effectively  Mucinex can be used as needed to thin secretions Hypertonic saline nebulizations to be used once or twice a day as needed  No recent need for antibiotic Has had multiple courses of antibiotics Continues to follow-up with infectious disease as well  Late 2021, completed a course of treatment with tobramycin and cefepime/meropenem -Significant improvement in symptoms following treatment with tobramycin in the past  She ended up at urgent care where she was treated with a course of doxycycline recently following an exacerbation Was called in a prescription for tobramycin following last visit, started to feel better soon after, so has not used the tobramycin  She has not been using the flutter device or hypertonic saline nebulizations  She last followed up with Dr.Comer, In January  Historically: Chronic lung infection started at age 60 with bronchiectasis right lung Recent CT did show some involvement of the left  Uses nebulization treatments-uses hypertonic saline, has not been religious with its use Uses Mucinex intermittently  She does have a regular cough, increased sputum the last few days  Has shortness of breath with wheezing Occasional throat discomfort  Was evaluated about 2018 in [place] with a chest x-ray and CT scan which I reviewed with the patient showing 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   fluticasone-salmeterol (ADVAIR HFA) 230-21 MCG/ACT inhaler Inhale 2 puffs into the lungs 2 (two) times daily.   norethindrone-ethinyl estradiol-FE (LOESTRIN FE 1/20) 1-20 MG-MCG tablet Take 1 tablet by mouth daily.   sodium chloride HYPERTONIC 3 % nebulizer solution Take by nebulization as needed for other.   [DISCONTINUED] sodium chloride 0.9 % nebulizer solution Take 3 mLs by nebulization 2 (two) times daily as needed for wheezing.   No facility-administered encounter medications on file as of 09/20/2022.    Allergies as of 09/20/2022 - Review Complete 09/20/2022  Allergen Reaction Noted   Cefepime Other (See Comments) 11/08/2020    Past Medical History:  Diagnosis Date   Asthma    Bronchitis     Past Surgical History:  Procedure Laterality Date   BRONCHOSCOPY N/A    over seven years ago   CESAREAN SECTION  2011    Family History  Problem Relation Age of Onset   Osteoarthritis Mother    Cancer Neg Hx     Social History   Socioeconomic History   Marital status: Single    Spouse name: Not on file   Number of children: Not on file   Years of education: Not on file   Highest education level: Not on file  Occupational History  Not on file  Tobacco Use   Smoking status: Never   Smokeless tobacco: Never  Vaping Use   Vaping Use: Never used  Substance and Sexual Activity   Alcohol use: Yes    Comment: occasional   Drug use: Never   Sexual activity: Yes    Birth control/protection: Condom  Other Topics Concern   Not on file  Social History Narrative   From [place]   Currently at [employer] to [place] at end  of 2020   Social Determinants of Corporate investment banker Strain: Not on file  Food Insecurity: Not on file  Transportation Needs: Not on file  Physical Activity: Not on file  Stress: Not on file  Social Connections: Not on file  Intimate Partner Violence: Not on file    Review of Systems  Constitutional:  Negative for fatigue and fever.  HENT:  Negative for sinus pain.   Respiratory:  Positive for cough and shortness of breath.   Cardiovascular: Negative.     Vitals:   09/20/22 1411  BP: 104/68  Pulse: 77  SpO2: 98%     Physical Exam Constitutional:      Appearance: Normal appearance.  HENT:     Head: Normocephalic.     Nose: No congestion or rhinorrhea.     Mouth/Throat:     Pharynx: No oropharyngeal exudate or posterior oropharyngeal erythema.  Cardiovascular:     Rate and Rhythm: Normal rate and regular rhythm.     Pulses: Normal pulses.     Heart sounds: Normal heart sounds. No murmur heard.    No friction rub.  Pulmonary:     Effort: Pulmonary effort is normal. No respiratory distress.     Breath sounds: No stridor. No wheezing or rhonchi.  Musculoskeletal:     Cervical back: No rigidity or tenderness.  Neurological:     Mental Status: She is alert.  Psychiatric:        Mood and Affect: Mood normal.    Data Reviewed:  2021 shows improvement in endobronchial collection  Last CT chest was 02/21/2021-loss of right lung parenchyma  Assessment:  Bronchiectasis -Has not had a recent exacerbation -Symptoms appear controlled at present with daily hypertonic saline nebulizations  Shortness of breath -Stable recently  Constant sputum production No acute infectious process at present -Clear mucus production  History of asthma  .  Continue Advair for asthma  For mucus clearance -Nebulization with hypertonic saline -Flutter device use   Plan/Recommendations: .  Continue Advair .  Antibiotics as needed-we will give her a course of Augmentin to  have on hand .  Follow-up in about 3 months  .  Vest therapy as tolerated  .  Sputum cultures if significant expectoration with changes in characteristics  Encouraged to call with any significant concerns     Virl Diamond MD [facility] 09/20/2022, 2:30 PM  CC: Jarold Motto, PA

## 2022-09-21 ENCOUNTER — Encounter: Payer: Self-pay | Admitting: Pulmonary Disease

## 2022-10-21 ENCOUNTER — Encounter: Payer: Self-pay | Admitting: Pulmonary Disease

## 2022-10-22 ENCOUNTER — Other Ambulatory Visit: Payer: Self-pay | Admitting: Pulmonary Disease

## 2022-10-22 MED ORDER — BUDESONIDE-FORMOTEROL FUMARATE 160-4.5 MCG/ACT IN AERO: 2.0000 | INHALATION_SPRAY | Freq: Two times a day (BID) | RESPIRATORY_TRACT | 3 refills | Status: DC

## 2022-10-22 NOTE — Telephone Encounter (Signed)
Prescription for Symbicort placed to [facility]

## 2022-10-22 NOTE — Telephone Encounter (Signed)
Dr. Val Eagle, please see mychart  messages sent and advise.

## 2022-10-28 ENCOUNTER — Other Ambulatory Visit: Payer: Self-pay | Admitting: *Deleted

## 2022-10-28 MED ORDER — FLUTICASONE-SALMETEROL 230-21 MCG/ACT IN AERO: 2.0000 | INHALATION_SPRAY | Freq: Two times a day (BID) | RESPIRATORY_TRACT | 12 refills | Status: DC

## 2022-11-04 ENCOUNTER — Ambulatory Visit: Payer: 59 | Admitting: Physician Assistant

## 2022-11-04 ENCOUNTER — Other Ambulatory Visit (HOSPITAL_COMMUNITY)
Admission: RE | Admit: 2022-11-04 | Discharge: 2022-11-04 | Disposition: A | Discharge status: Home / Self Care | Payer: 59 | Source: Ambulatory Visit | Attending: Physician Assistant | Admitting: Physician Assistant

## 2022-11-04 ENCOUNTER — Encounter: Payer: Self-pay | Admitting: Physician Assistant

## 2022-11-04 VITALS — BP 102/76 | HR 67 | Temp 97.3°F | Resp 16 | Ht 63.0 in | Wt 115.6 lb

## 2022-11-04 DIAGNOSIS — Z3169 Encounter for other general counseling and advice on procreation: Secondary | ICD-10-CM | POA: Diagnosis not present

## 2022-11-04 DIAGNOSIS — N760 Acute vaginitis: Secondary | ICD-10-CM | POA: Diagnosis not present

## 2022-11-04 LAB — COMPREHENSIVE METABOLIC PANEL
ALT: 9 U/L (ref 0–35)
AST: 13 U/L (ref 0–37)
Albumin: 4 g/dL (ref 3.5–5.2)
Alkaline Phosphatase: 60 U/L (ref 39–117)
BUN: 13 mg/dL (ref 6–23)
CO2: 32 mEq/L (ref 19–32)
Calcium: 9.9 mg/dL (ref 8.4–10.5)
Chloride: 100 mEq/L (ref 96–112)
Creatinine, Ser: 0.59 mg/dL (ref 0.40–1.20)
GFR: 112.51 mL/min (ref >60.00)
Glucose, Bld: 74 mg/dL (ref 70–99)
Potassium: 4.4 mEq/L (ref 3.5–5.1)
Sodium: 137 mEq/L (ref 135–145)
Total Bilirubin: 0.4 mg/dL (ref 0.2–1.2)
Total Protein: 8.5 g/dL — ABNORMAL HIGH (ref 6.0–8.3)

## 2022-11-04 LAB — CBC WITH DIFFERENTIAL/PLATELET
Basophils Absolute: 0.1 10*3/uL (ref 0.0–0.1)
Basophils Relative: 1.5 % (ref 0.0–3.0)
Eosinophils Absolute: 1.5 10*3/uL — ABNORMAL HIGH (ref 0.0–0.7)
Eosinophils Relative: 15.6 % — ABNORMAL HIGH (ref 0.0–5.0)
HCT: 41.4 % (ref 36.0–46.0)
Hemoglobin: 13.9 g/dL (ref 12.0–15.0)
Lymphocytes Relative: 37.1 % (ref 12.0–46.0)
Lymphs Abs: 3.6 10*3/uL (ref 0.7–4.0)
MCHC: 33.5 g/dL (ref 30.0–36.0)
MCV: 78.1 fl (ref 78.0–100.0)
Monocytes Absolute: 0.6 10*3/uL (ref 0.1–1.0)
Monocytes Relative: 6.2 % (ref 3.0–12.0)
Neutro Abs: 3.8 10*3/uL (ref 1.4–7.7)
Neutrophils Relative %: 39.6 % — ABNORMAL LOW (ref 43.0–77.0)
Platelets: 429 10*3/uL — ABNORMAL HIGH (ref 150.0–400.0)
RBC: 5.31 Mil/uL — ABNORMAL HIGH (ref 3.87–5.11)
RDW: 14.2 % (ref 11.5–15.5)
WBC: 9.7 10*3/uL (ref 4.0–10.5)

## 2022-11-04 LAB — TSH: TSH: 2.41 u[IU]/mL (ref 0.35–5.50)

## 2022-11-04 NOTE — Patient Instructions (Addendum)
It was great to see you!  I would like to refer to ob and gynecology. Dr. Juliene Pina is my favorite at [facility] but any provider there is great [address], [place], [place] [address] [phone]  Start prenatal vitamins. We will do blood work and vaginal swab today.     Take care,  Jarold Motto PA-C

## 2022-11-04 NOTE — Progress Notes (Signed)
Erica Khan is a 41 y.o. female here for a follow up of a pre-existing problem.  History of Present Illness:   Chief Complaint  Patient presents with  . planned conception    Two months of trying to conceive Last menstrual 10/31/22    HPI  Pre-conception counseling For the past 2 months trying to have a baby and has been successful. Has had one child at age of 51 and did not have difficulty getting pregnant. She does not see gyn. She has regular periods and tracks them on her phone.   Vaginitis She had an episode of vaginal discharge recently. She had white discharge with associated itching and irritation Denies fevers, chills, pelvic pain, bleeding, urinary symptoms She is having regular, unprotected sex with 1 partner She now just has some slight irritation/itching Has not tried anything for her symptoms   Past Medical History:  Diagnosis Date  . Asthma   . Bronchitis      Social History   Tobacco Use  . Smoking status: Never  . Smokeless tobacco: Never  Vaping Use  . Vaping Use: Never used  Substance Use Topics  . Alcohol use: Yes    Comment: occasional  . Drug use: Never    Past Surgical History:  Procedure Laterality Date  . BRONCHOSCOPY N/A    over seven years ago  . CESAREAN SECTION  2011    Family History  Problem Relation Age of Onset  . Osteoarthritis Mother   . Cancer Neg Hx     Allergies  Allergen Reactions  . Cefepime Other (See Comments)    Diffuse rash and wheezing    Current Medications:   Current Outpatient Medications:  .  albuterol (VENTOLIN HFA) 108 (90 Base) MCG/ACT inhaler, Inhale 2 puffs into the lungs every 4 (four) hours as needed for wheezing or shortness of breath., Disp: 1 each, Rfl: 11 .  budesonide-formoterol (SYMBICORT) 160-4.5 MCG/ACT inhaler, Inhale 2 puffs into the lungs every 12 (twelve) hours., Disp: 1 each, Rfl: 3 .  fluticasone-salmeterol (ADVAIR HFA) 230-21 MCG/ACT inhaler, Inhale 2 puffs into the lungs 2  (two) times daily., Disp: 12 g, Rfl: 12 .  sodium chloride HYPERTONIC 3 % nebulizer solution, Take by nebulization as needed for other., Disp: 750 mL, Rfl: 6 .  benzonatate (TESSALON) 200 MG capsule, Take 1 capsule (200 mg total) by mouth 3 (three) times daily as needed for cough. (Patient not taking: Reported on 11/04/2022), Disp: 90 capsule, Rfl: 1   Review of Systems:   ROS Negative unless otherwise specified per HPI.  Vitals:   Vitals:   11/04/22 0911  BP: 102/76  Pulse: 67  Resp: 16  Temp: (!) 97.3 F (36.3 C)  TempSrc: Temporal  SpO2: 100%  Weight: 115 lb 9.6 oz (52.4 kg)  Height: 5\' 3"  (1.6 m)     Body mass index is 20.48 kg/m.  Physical Exam:   Physical Exam Vitals and nursing note reviewed.  Constitutional:      General: She is not in acute distress.    Appearance: She is well-developed. She is not ill-appearing or toxic-appearing.  Cardiovascular:     Rate and Rhythm: Normal rate and regular rhythm.     Pulses: Normal pulses.     Heart sounds: Normal heart sounds, S1 normal and S2 normal.  Pulmonary:     Effort: Pulmonary effort is normal.     Breath sounds: Normal breath sounds.  Skin:    General: Skin is warm and dry.  Neurological:     Mental Status: She is alert.     GCS: GCS eye subscore is 4. GCS verbal subscore is 5. GCS motor subscore is 6.  Psychiatric:        Speech: Speech normal.        Behavior: Behavior normal. Behavior is cooperative.     Assessment and Plan:   Encounter for preconception consultation Discussed Referral to [facility] due to age Recommended starting prenatal MVI Continue exercise and healthy eating Will update basic blood work today and ensure no significant abnormalities -- will defer additional blood work to ob  Acute vaginitis No red flags Self swab obtained Will defer treatment to results  Jarold Motto, PA-C

## 2022-11-08 LAB — CERVICOVAGINAL ANCILLARY ONLY
Bacterial Vaginitis (gardnerella): POSITIVE — AB
Candida Glabrata: NEGATIVE
Candida Vaginitis: NEGATIVE
Chlamydia: NEGATIVE
Comment: NEGATIVE
Comment: NEGATIVE
Comment: NEGATIVE
Comment: NEGATIVE
Comment: NEGATIVE
Comment: NORMAL
Neisseria Gonorrhea: NEGATIVE
Trichomonas: NEGATIVE

## 2022-11-09 ENCOUNTER — Other Ambulatory Visit: Payer: Self-pay | Admitting: Physician Assistant

## 2022-11-09 DIAGNOSIS — D721 Eosinophilia, unspecified: Secondary | ICD-10-CM

## 2022-11-09 DIAGNOSIS — R778 Other specified abnormalities of plasma proteins: Secondary | ICD-10-CM

## 2022-11-09 MED ORDER — METRONIDAZOLE 500 MG PO TABS: 500.0000 mg | ORAL_TABLET | Freq: Two times a day (BID) | ORAL | 0 refills | Status: AC

## 2022-11-16 ENCOUNTER — Other Ambulatory Visit: Payer: Self-pay

## 2022-11-16 MED ORDER — BUDESONIDE-FORMOTEROL FUMARATE 160-4.5 MCG/ACT IN AERO: 2.0000 | INHALATION_SPRAY | Freq: Two times a day (BID) | RESPIRATORY_TRACT | 3 refills | Status: DC

## 2022-11-17 ENCOUNTER — Ambulatory Visit: Payer: BC Managed Care – PPO | Admitting: Pulmonary Disease

## 2022-11-18 ENCOUNTER — Encounter: Payer: Self-pay | Admitting: Physician Assistant

## 2022-11-19 ENCOUNTER — Other Ambulatory Visit: Payer: Self-pay | Admitting: Physician Assistant

## 2022-11-19 MED ORDER — NORETHIN ACE-ETH ESTRAD-FE 1-20 MG-MCG PO TABS: 1.0000 | ORAL_TABLET | Freq: Every day | ORAL | 11 refills | Status: AC

## 2022-11-23 ENCOUNTER — Encounter: Payer: Self-pay | Admitting: Pulmonary Disease

## 2022-11-23 DIAGNOSIS — J479 Bronchiectasis, uncomplicated: Secondary | ICD-10-CM

## 2022-11-23 NOTE — Telephone Encounter (Signed)
Received the following message from patient:   "Hi Doc,   For some reason my cough has increased with little tightness in chest. Mucus seemed coming out  with difficulty too and there is increased wheezing in my chest. I already had augmentin in Nov last week. I request you to kindly prescribe me for sputum test and as I have an upcoming appointment with you, so you can see my lungs condition better.    I had symptoms of flu in mid December that I let recovered body on its own by taking rest and a lot of liquidy hot food. But this mucus and urge to cough is annoying.    Thanks  Erica Khan"  Dr. Ander Slade, can you please advise? Thanks!

## 2022-11-24 NOTE — Telephone Encounter (Signed)
Okay to send an order for sputum Gram stain and cultures

## 2022-12-01 DIAGNOSIS — B078 Other viral warts: Secondary | ICD-10-CM | POA: Diagnosis not present

## 2022-12-07 IMAGING — XA IR PICC >5YO
1 series · 3 of 3 positions shown · non-contrast
Comparison: none

CLINICAL DATA: Pseudomonas infection, needs durable venous access
for planned antibiotic regimen

EXAM:
PICC PLACEMENT WITH ULTRASOUND AND FLUOROSCOPY
FLUOROSCOPY TIME:  18 seconds; 1 mGy
TECHNIQUE: After written informed consent was obtained, patient was placed in
the supine position on angiographic table. Patency of the right
basilic vein was confirmed with ultrasound with image documentation.
An appropriate skin site was determined. Skin site was marked.
Region was prepped using maximum barrier technique including cap and
mask, sterile gown, sterile gloves, large sterile sheet, and
Chlorhexidine as cutaneous antisepsis. The region was infiltrated
locally with 1% lidocaine. Under real-time ultrasound guidance, the
right basilic vein was accessed with a 21 gauge micropuncture
needle; the needle tip within the vein was confirmed with ultrasound
image documentation. Needle exchanged over a 018 guidewire for a
peel-away sheath, through which a 5-French single-lumen power
injector PICC trimmed to 41cm was advanced, positioned with its tip
near the cavoatrial junction. Spot chest radiograph confirms
appropriate catheter position. Catheter was flushed per protocol and
secured externally. The patient tolerated procedure well.
COMPLICATIONS:
COMPLICATIONS
none

[Series 1: fl angio · 3 of 3 slices shown]
[im 1/3]
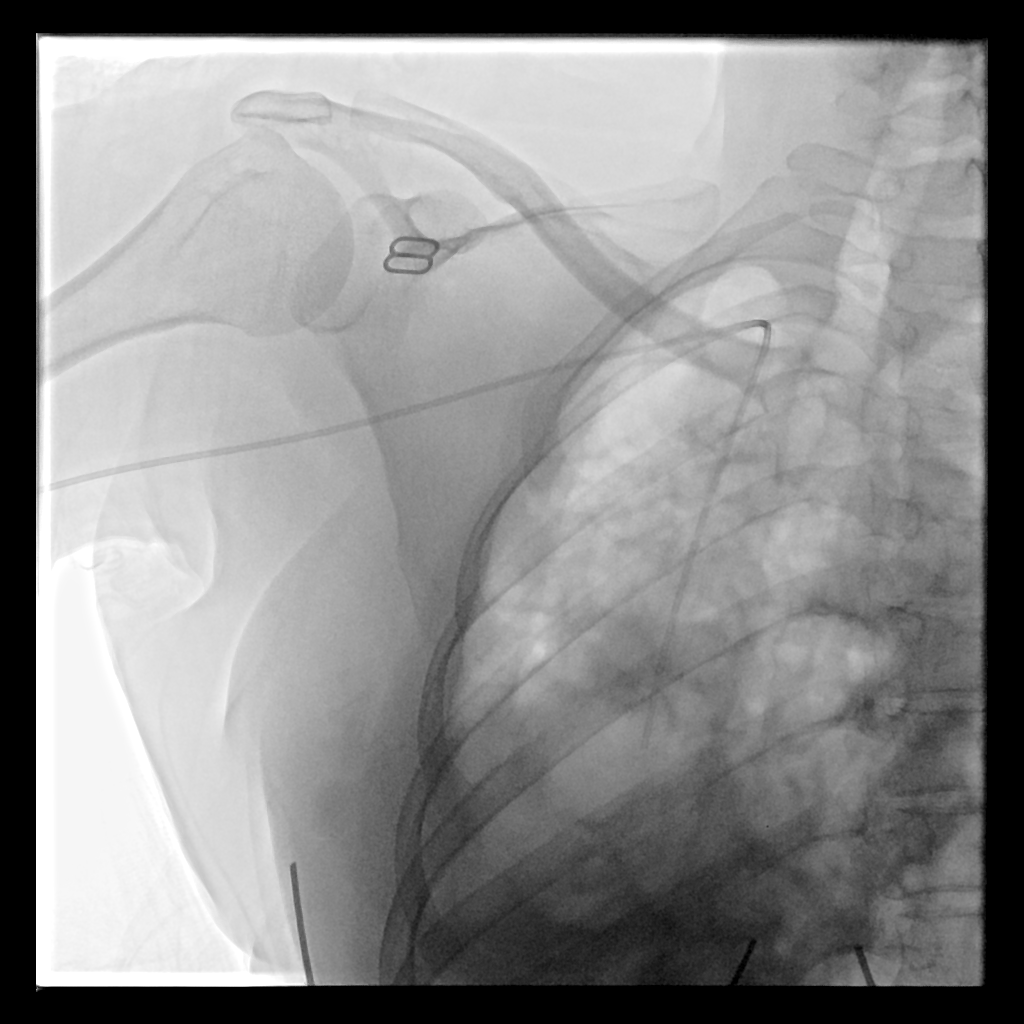
[im 2/3]
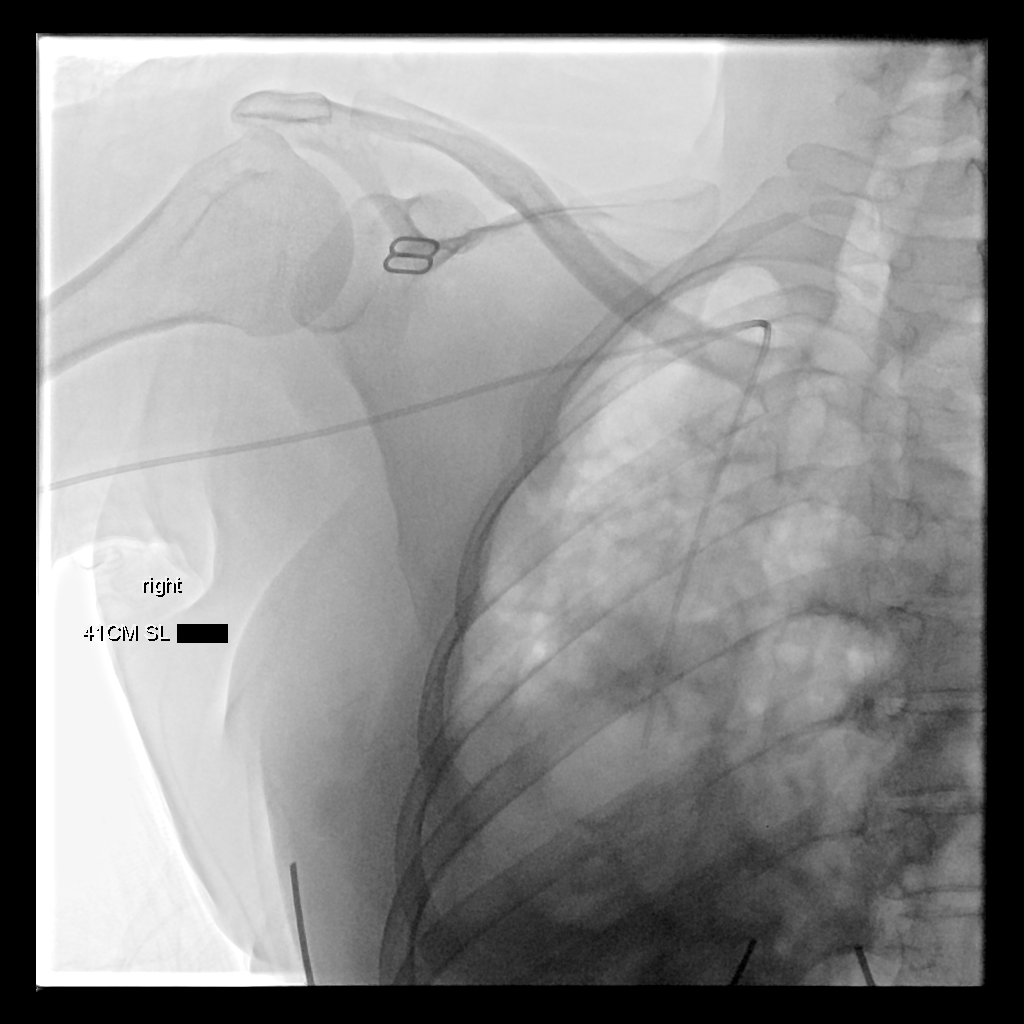
[im 3/3]
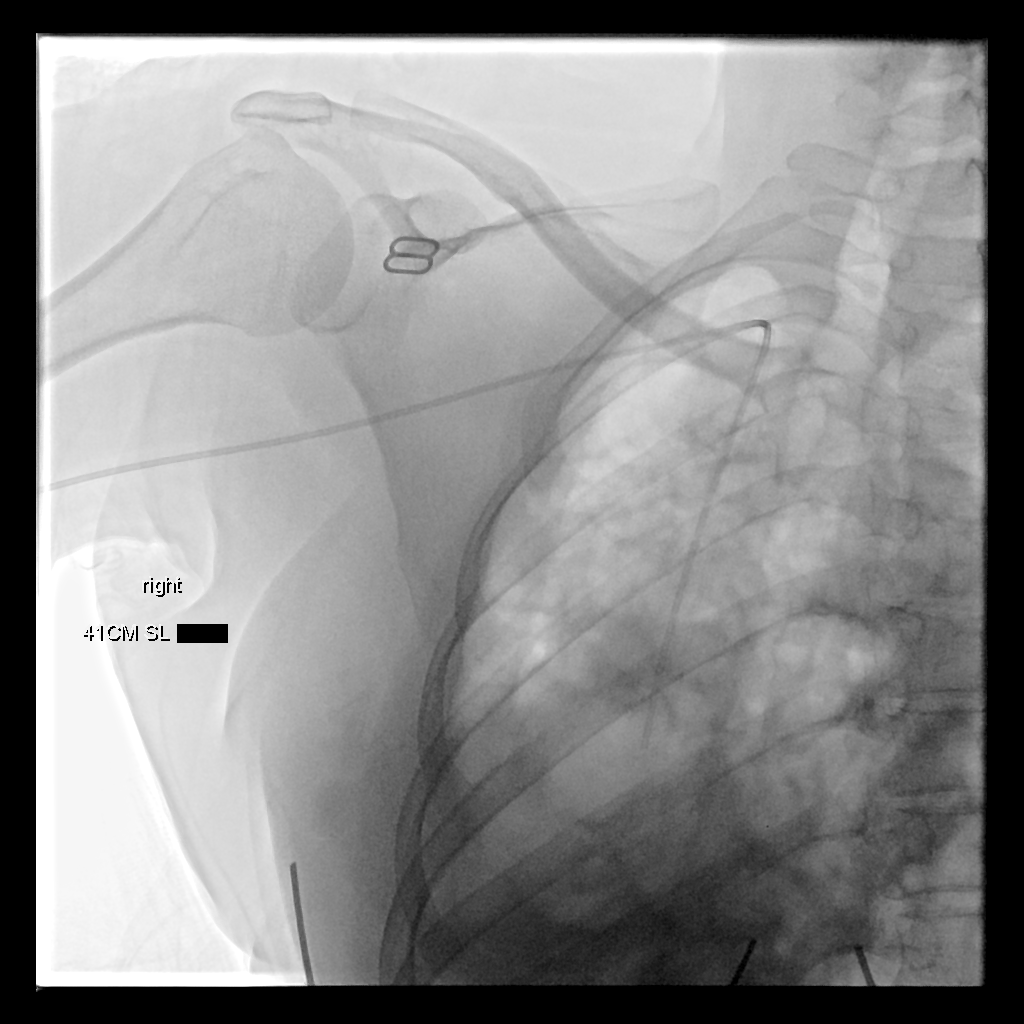

[3 of 3 positions shown; findings below may reference images not displayed]

IMPRESSION: 1. Technically successful five French single lumen power-injectable
PICC placement

## 2022-12-14 IMAGING — DX DG CHEST 1V
1 series · 1 of 1 positions shown · non-contrast
Comparison: None.

CLINICAL DATA: PICC line problem.

EXAM:
CHEST  1 VIEW

[chest ap]
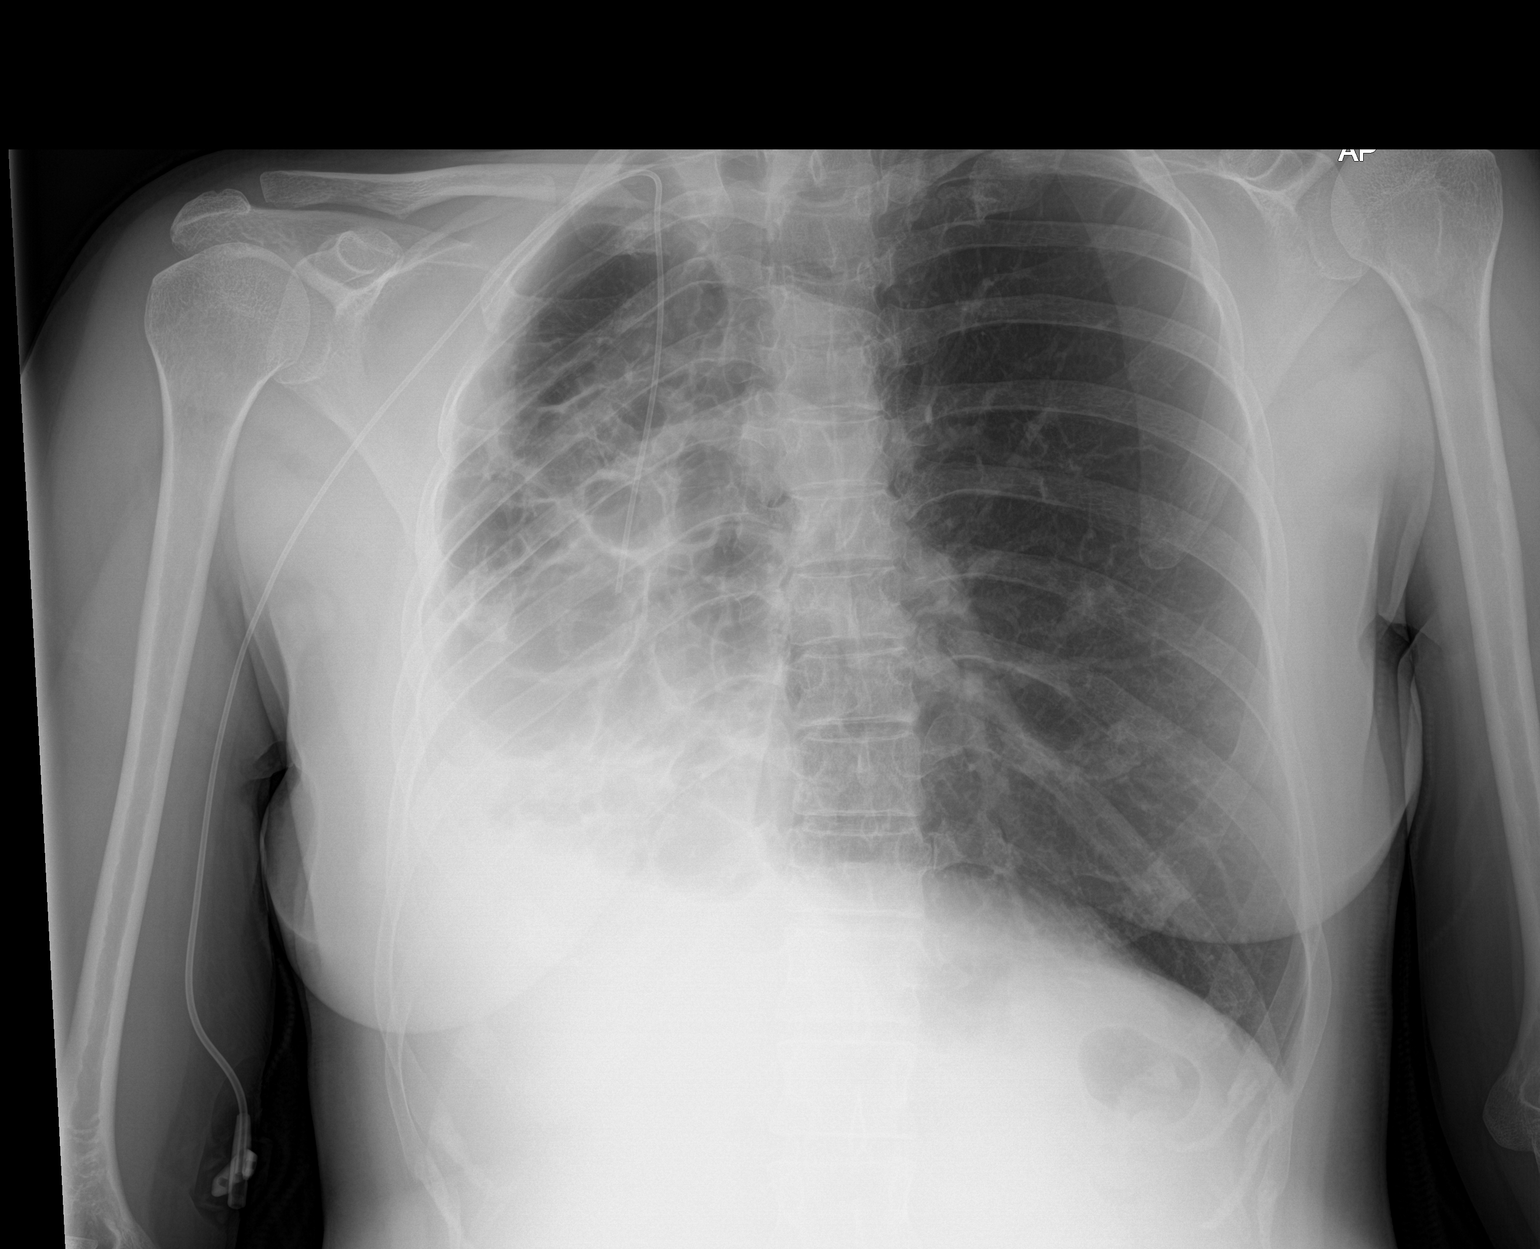

[1 of 1 positions shown; findings below may reference images not displayed]

FINDINGS: There is a right-sided PICC line in place with the tip terminating
near the expected location of the cavoatrial junction. There is a
markedly abnormal appearance of the right lung field as before with
air and fluid-filled cysts noted. There is shift of the mediastinum
to the right. The left lung field is mostly clear. There is no acute
osseous abnormality.
IMPRESSION: 1. Right-sided PICC line terminates near the expected location of
the cavoatrial junction.
2. Otherwise, no significant interval change.

## 2022-12-20 ENCOUNTER — Ambulatory Visit: Payer: 59 | Admitting: Pulmonary Disease

## 2022-12-20 ENCOUNTER — Encounter: Payer: Self-pay | Admitting: Pulmonary Disease

## 2022-12-20 VITALS — BP 108/68 | HR 78 | Temp 98.1°F | Ht 63.0 in | Wt 110.2 lb

## 2022-12-20 DIAGNOSIS — Z8709 Personal history of other diseases of the respiratory system: Secondary | ICD-10-CM

## 2022-12-20 DIAGNOSIS — R0609 Other forms of dyspnea: Secondary | ICD-10-CM

## 2022-12-20 DIAGNOSIS — J471 Bronchiectasis with (acute) exacerbation: Secondary | ICD-10-CM

## 2022-12-20 MED ORDER — AMOXICILLIN-POT CLAVULANATE 875-125 MG PO TABS: 1.0000 | ORAL_TABLET | Freq: Two times a day (BID) | ORAL | 0 refills | Status: DC

## 2022-12-20 MED ORDER — BENZONATATE 200 MG PO CAPS: 200.0000 mg | ORAL_CAPSULE | Freq: Three times a day (TID) | ORAL | 1 refills | Status: AC | PRN

## 2022-12-20 MED ORDER — ZOLPIDEM TARTRATE 5 MG PO TABS: 5.0000 mg | ORAL_TABLET | Freq: Every evening | ORAL | 0 refills | Status: DC | PRN

## 2022-12-20 NOTE — Progress Notes (Signed)
Erica Khan    [id number]    1981/06/02  Primary Care Physician:Worley, Aldona Bar, [place]  Referring Physician: Inda Coke, [address] [place],  [place] [address]  Chief complaint:   Follow-up for bronchiectasis  HPI:  Did have an exacerbation of symptoms about 10 days ago Left her with increased work of breathing, shortness of breath, some cough, sputum production  She last used a course of Augmentin in November  Advair did not work well for her, has been using Symbicort which works better Saline nebulization as needed on a daily basis  She is bringing up more phlegm recently Denies any fevers or chills Denies any chest pains or chest discomfort  Last CT was in 2022 Chest x-ray in 2023 was stable-from primary doctor's office  Late 2021, completed a course of treatment with tobramycin and cefepime/meropenem -Significant improvement in symptoms following treatment with tobramycin in the past  She ended up at urgent care where she was treated with a course of doxycycline recently following an exacerbation Was called in a prescription for tobramycin following last visit, started to feel better soon after, so has not used the tobramycin  She last followed up with Dr.Comer, In January 2023  Historically: Chronic lung infection started at age 37 with bronchiectasis right lung Recent CT did show some involvement of the left  Uses nebulization treatments-uses hypertonic saline, has not been religious with its use Uses Mucinex intermittently  She does have a regular cough, increased sputum the last few days  Has shortness of breath with wheezing Occasional throat discomfort  Was evaluated about 2018 in [place] with a chest x-ray and CT scan which I reviewed with the patient showing extensive bronchiectatic changes with completely destroyed right lung  She has had bronchoscopies at least twice in [place], does not recollect any organism being  cultured  Current culture did reveal Pseudomonas before treatment with antibiotics-tobramycin  Outpatient Encounter Medications as of 12/20/2022  Medication Sig   albuterol (VENTOLIN HFA) 108 (90 Base) MCG/ACT inhaler Inhale 2 puffs into the lungs every 4 (four) hours as needed for wheezing or shortness of breath.   budesonide-formoterol (SYMBICORT) 160-4.5 MCG/ACT inhaler Inhale 2 puffs into the lungs every 12 (twelve) hours.   norethindrone-ethinyl estradiol-FE (LOESTRIN FE 1/20) 1-20 MG-MCG tablet Take 1 tablet by mouth daily.   sodium chloride HYPERTONIC 3 % nebulizer solution Take by nebulization as needed for other.   benzonatate (TESSALON) 200 MG capsule Take 1 capsule (200 mg total) by mouth 3 (three) times daily as needed for cough. (Patient not taking: Reported on 11/04/2022)   fluticasone-salmeterol (ADVAIR HFA) 230-21 MCG/ACT inhaler Inhale 2 puffs into the lungs 2 (two) times daily. (Patient not taking: Reported on 12/20/2022)   No facility-administered encounter medications on file as of 12/20/2022.    Allergies as of 12/20/2022 - Review Complete 12/20/2022  Allergen Reaction Noted   Cefepime Other (See Comments) 11/08/2020    Past Medical History:  Diagnosis Date   Asthma    Bronchitis     Past Surgical History:  Procedure Laterality Date   BRONCHOSCOPY N/A    over seven years ago   CESAREAN SECTION  2011    Family History  Problem Relation Age of Onset   Osteoarthritis Mother    Cancer Neg Hx     Social History   Socioeconomic History   Marital status: Single    Spouse name: Not on file   Number of children: Not  on file   Years of education: Not on file   Highest education level: Not on file  Occupational History   Not on file  Tobacco Use   Smoking status: Never   Smokeless tobacco: Never  Vaping Use   Vaping Use: Never used  Substance and Sexual Activity   Alcohol use: Yes    Comment: occasional   Drug use: Never   Sexual activity: Yes     Birth control/protection: Condom  Other Topics Concern   Not on file  Social History Narrative   From [place]   Currently at [employer] to [place] at end of 2020   Social Determinants of Radio broadcast assistant Strain: Not on file  Food Insecurity: Not on file  Transportation Needs: Not on file  Physical Activity: Not on file  Stress: Not on file  Social Connections: Not on file  Intimate Partner Violence: Not on file    Review of Systems  Constitutional:  Negative for fatigue and fever.  HENT:  Negative for sinus pain.   Respiratory:  Positive for cough and shortness of breath.   Cardiovascular: Negative.     Vitals:   12/20/22 1613  BP: 108/68  Pulse: 78  Temp: 98.1 F (36.7 C)  SpO2: 98%     Physical Exam Constitutional:      Appearance: Normal appearance.  HENT:     Head: Normocephalic.     Nose: No congestion or rhinorrhea.     Mouth/Throat:     Pharynx: No oropharyngeal exudate or posterior oropharyngeal erythema.  Cardiovascular:     Rate and Rhythm: Normal rate and regular rhythm.     Pulses: Normal pulses.     Heart sounds: Normal heart sounds. No murmur heard.    No friction rub.  Pulmonary:     Effort: Pulmonary effort is normal. No respiratory distress.     Comments: Coarse breath sounds on the left, decreased air movement on right Musculoskeletal:     Cervical back: No rigidity or tenderness.  Neurological:     Mental Status: She is alert.  Psychiatric:        Mood and Affect: Mood normal.    Data Reviewed:  2021 shows improvement in endobronchial collection  Last CT chest was 02/21/2021-loss of right lung parenchyma  Recently had blood work done at primary doctor's office showing WBC count of 9.7, hematocrit of 42, platelet count of 429 Recent Chem-7 shows BUN of 13 creatinine 0.88 Recent eosinophil greater than 1.5, previously 0.3  Assessment:  Bronchiectasis -Has an exacerbation ongoing -Increase shortness of breath -Increased  sputum production  Shortness of breath -Slight worsening recently  Constant sputum production -Will send sputum for Gram stain and cultures  History of asthma  .  Continue Symbicort for asthma  For mucus clearance -Nebulization with hypertonic saline -Flutter device use  Insomnia -More significant recently   Plan/Recommendations:  .  Continue vest therapy. .  Prescription for Augmentin .  Prescription for [employer] .  Sputum for Gram stain and cultures .  Ambien for insomnia  .  Need to repeat blood work at some point  .  Encouraged to call with significant concerns  .  Tentative follow-up in 3 months  Sherrilyn Rist MD [facility] 12/20/2022, 4:19 PM  CC: Inda Coke, PA

## 2022-12-20 NOTE — Patient Instructions (Signed)
Sputum Gram stain and cultures  Will send in a prescription for Augmentin  Prescription for Tessalon Perles  Prescription for Ambien to help you sleep  Follow-up in 3 months  Call with significant concerns  Continue using your Symbicort  We need to repeat your blood work at some point soon

## 2022-12-22 ENCOUNTER — Other Ambulatory Visit: Payer: 59

## 2022-12-22 DIAGNOSIS — J479 Bronchiectasis, uncomplicated: Secondary | ICD-10-CM | POA: Diagnosis not present

## 2022-12-25 LAB — RESPIRATORY CULTURE OR RESPIRATORY AND SPUTUM CULTURE
MICRO NUMBER:: 14499363
RESULT:: NORMAL
SPECIMEN QUALITY:: ADEQUATE

## 2023-01-12 ENCOUNTER — Encounter: Payer: Self-pay | Admitting: Pulmonary Disease

## 2023-01-13 NOTE — Telephone Encounter (Signed)
Dr. Ander Slade, please advise on pt's bacterial sputum results. Thanks.

## 2023-01-28 NOTE — Telephone Encounter (Signed)
AFB cultures do take a while to finalize  Cultures so far just shows normal oropharyngeal flora-organisms that reside at the back of ones throat

## 2023-02-05 LAB — AFB CULTURE WITH SMEAR (NOT AT ARMC)
Acid Fast Culture: NEGATIVE
Acid Fast Smear: NEGATIVE

## 2023-02-18 NOTE — Telephone Encounter (Signed)
Dr. Ander Slade please advise on the following My Chart message:   Zerline Boddy  P [facility] (supporting Adewale Clearence Ped, MD)1 hour ago (12:12 PM)   NM Hi,   Happy Friday.    I just want to highlight that I do have a constant tightness or inflammation in my right side chest. Due to which I get short breath very easily even while talking and not doing anything at all.    Is it normal?   Thank you   C[place]ney  [id number]  Thank you

## 2023-02-18 NOTE — Telephone Encounter (Signed)
Will not say it is normal.  May happen in somebody whose been coughing much more than usual, may bruise a rib of muscle strain that takes a while to resolve  If symptoms persist, may be time to get a chest x-ray to make sure that nothing is amiss  At some point will will also need to repeat your CT scan to follow-up on the findings that were there before  Your AFB results were negative from your recent sputum

## 2023-03-21 ENCOUNTER — Other Ambulatory Visit: Payer: Self-pay | Admitting: Pulmonary Disease

## 2023-03-22 ENCOUNTER — Other Ambulatory Visit: Payer: Self-pay | Admitting: Pulmonary Disease

## 2023-03-22 MED ORDER — AMOXICILLIN-POT CLAVULANATE 875-125 MG PO TABS: 1.0000 | ORAL_TABLET | Freq: Two times a day (BID) | ORAL | 0 refills | Status: AC

## 2023-06-08 ENCOUNTER — Other Ambulatory Visit: Payer: Self-pay | Admitting: Pulmonary Disease

## 2023-06-10 MED ORDER — ZOLPIDEM TARTRATE 5 MG PO TABS: 5.0000 mg | ORAL_TABLET | Freq: Every evening | ORAL | 0 refills | Status: AC | PRN

## 2023-06-10 NOTE — Telephone Encounter (Signed)
Received a refill request from patient. She is requesting a refill on her Ambien 5mg . Patient was last seen in January 2024 and advised to follow up in 3 months but has not yet.   AO, please advise if you are ok with this refill. Thanks!

## 2023-06-20 ENCOUNTER — Encounter: Payer: Self-pay | Admitting: Adult Health

## 2023-06-20 ENCOUNTER — Ambulatory Visit (INDEPENDENT_AMBULATORY_CARE_PROVIDER_SITE_OTHER): Payer: 59

## 2023-06-20 ENCOUNTER — Ambulatory Visit: Payer: 59 | Admitting: Adult Health

## 2023-06-20 VITALS — BP 112/66 | HR 63 | Ht 63.0 in | Wt 119.0 lb

## 2023-06-20 DIAGNOSIS — J471 Bronchiectasis with (acute) exacerbation: Secondary | ICD-10-CM

## 2023-06-20 DIAGNOSIS — Z8709 Personal history of other diseases of the respiratory system: Secondary | ICD-10-CM

## 2023-06-20 DIAGNOSIS — R0600 Dyspnea, unspecified: Secondary | ICD-10-CM | POA: Diagnosis not present

## 2023-06-20 DIAGNOSIS — J984 Other disorders of lung: Secondary | ICD-10-CM | POA: Diagnosis not present

## 2023-06-20 DIAGNOSIS — R0609 Other forms of dyspnea: Secondary | ICD-10-CM

## 2023-06-20 LAB — CBC WITH DIFFERENTIAL/PLATELET
Basophils Absolute: 0.2 10*3/uL — ABNORMAL HIGH (ref 0.0–0.1)
Basophils Relative: 1.9 % (ref 0.0–3.0)
Eosinophils Absolute: 1.4 10*3/uL — ABNORMAL HIGH (ref 0.0–0.7)
Eosinophils Relative: 16.2 % — ABNORMAL HIGH (ref 0.0–5.0)
HCT: 40.8 % (ref 36.0–46.0)
Hemoglobin: 13.5 g/dL (ref 12.0–15.0)
Lymphocytes Relative: 38.6 % (ref 12.0–46.0)
Lymphs Abs: 3.4 10*3/uL (ref 0.7–4.0)
MCHC: 33 g/dL (ref 30.0–36.0)
MCV: 79 fl (ref 78.0–100.0)
Monocytes Absolute: 0.6 10*3/uL (ref 0.1–1.0)
Monocytes Relative: 6.4 % (ref 3.0–12.0)
Neutro Abs: 3.3 10*3/uL (ref 1.4–7.7)
Neutrophils Relative %: 36.9 % — ABNORMAL LOW (ref 43.0–77.0)
Platelets: 337 10*3/uL (ref 150.0–400.0)
RBC: 5.17 Mil/uL — ABNORMAL HIGH (ref 3.87–5.11)
RDW: 14.5 % (ref 11.5–15.5)
WBC: 8.8 10*3/uL (ref 4.0–10.5)

## 2023-06-20 LAB — BASIC METABOLIC PANEL
BUN: 8 mg/dL (ref 6–23)
CO2: 30 mEq/L (ref 19–32)
Calcium: 9.8 mg/dL (ref 8.4–10.5)
Chloride: 101 mEq/L (ref 96–112)
Creatinine, Ser: 0.65 mg/dL (ref 0.40–1.20)
GFR: 109.44 mL/min (ref >60.00)
Glucose, Bld: 90 mg/dL (ref 70–99)
Potassium: 4.3 mEq/L (ref 3.5–5.1)
Sodium: 138 mEq/L (ref 135–145)

## 2023-06-20 LAB — D-DIMER, QUANTITATIVE: D-Dimer, Quant: 0.3 mcg/mL FEU (ref <0.50)

## 2023-06-20 MED ORDER — SODIUM CHLORIDE 3 % IN NEBU: INHALATION_SOLUTION | RESPIRATORY_TRACT | 6 refills | Status: AC | PRN

## 2023-06-20 MED ORDER — MONTELUKAST SODIUM 10 MG PO TABS: 10.0000 mg | ORAL_TABLET | Freq: Every day | ORAL | 11 refills | Status: AC

## 2023-06-20 NOTE — Assessment & Plan Note (Signed)
Shortness of breath, chest tightness and fatigue questionable etiology.  May be an asthma flare.  Will check IgE and CBC with differential.  She did have recent travel will check a D-dimer if positive will need to go forward with a CTA to rule  out PE.  Low suspicion.  will maximize her asthma regimen.  Use triple therapy inhaler-   With Breztri inhaler.  Add in Singulair daily.  Zyrtec briefly.  Increase flutter valve to twice daily.    Continue on saline nebs.  Check PFTs on return  Plan  Patient Instructions  Chest xray today.  Labs today.  Begin Breztri sample 2 puffs Twice daily  , (hold Symbicort while taking-resume once sample is done )  Increase Flutter valve Twice daily   Continue on Saline neb daily  Albuterol inhaler As needed   Begin Singulair 10mg   At bedtime   Zyrtec 10mg  At bedtime  for 1 week and then As needed   Follow up with Dr. Wynona Neat in 6-8 weeks with PFT and As needed   Please contact office for sooner follow up if symptoms do not improve or worsen or seek emergency care  p

## 2023-06-20 NOTE — Progress Notes (Signed)
@Patient  ID: Erica Khan, female    DOB: January 30, 1981, 42 y.o.   MRN: [id number]  Chief Complaint  Patient presents with   Acute Visit    Referring provider: Jarold Motto, PA  HPI: 42 year old female never smoker followed for asthma and severe bronchiectasis-severe extensive bronchiectatic changes with severe volume loss in the right lung History of recurrent Pseudomonas (acute infection and colonization ) followed by infectious disease. Has been treated with Tobramycin neb , Cefepine/meropenem.  Patient is from [place].  TEST/EVENTS :  02/21/2021 CT chest without contrast: There is extensive architectural distortion of the right lung with essentially complete loss of normal right lung parenchyma. Appears stable when compared to prior. There is persistent bronchiectasis and bullous changes in the left upper lobe. There is also extensive pleural-parenchymal scarring at the left lung apex which is also stable.   06/20/2023 Acute OV : Dyspnea  Patient presents for an acute office visit.  Patient is followed for extensive bronchiectasis with severe architectural distortion and volume loss of the right lung.  She also has underlying asthma.  She complains over the last 4 weeks that her breathing has not been doing as well.  She feels more short of breath, fatigued, low energy and tightness.  She denies any cough, increased congestion or wheezing.  She says she feels very lethargic with no energy. She remains on Symbicort twice daily.  Rare use of albuterol.  Uses her flutter valve usually once a day.  Uses saline nebs once a day.  Last antibiotic was 2 months ago. CBC in  December 2023 showed elevated eosinophils with absolute count of 1500. Exhaled nitric oxide testing today was normal -5ppb.  Recent travel to [place].  She denies any chest pain, calf pain, history of VTE or hemoptysis .  Appetite is good with no nausea vomiting or diarrhea.  No known sick contacts.  Patient has 1 dog at home.   Works in [employer]ing [employer].       Allergies  Allergen Reactions   Cefepime Other (See Comments)    Diffuse rash and wheezing    Immunization History  Administered Date(s) Administered   Influenza,inj,Quad PF,6+ Mos 08/13/2021   Janssen (J&J) SARS-COV-2 Vaccination 01/30/2020, 11/05/2021    Past Medical History:  Diagnosis Date   Asthma    Bronchitis     Tobacco History: Social History   Tobacco Use  Smoking Status Never  Smokeless Tobacco Never   Counseling given: Not Answered   Outpatient Medications Prior to Visit  Medication Sig Dispense Refill   albuterol (VENTOLIN HFA) 108 (90 Base) MCG/ACT inhaler Inhale 2 puffs into the lungs every 4 (four) hours as needed for wheezing or shortness of breath. 1 each 11   amoxicillin-clavulanate (AUGMENTIN) 875-125 MG tablet TAKE 1 TABLET BY MOUTH TWICE A DAY (Patient not taking: Reported on 06/20/2023) 20 tablet 0   amoxicillin-clavulanate (AUGMENTIN) 875-125 MG tablet Take 1 tablet by mouth 2 (two) times daily. (Patient not taking: Reported on 06/20/2023) 14 tablet 0   benzonatate (TESSALON) 200 MG capsule Take 1 capsule (200 mg total) by mouth 3 (three) times daily as needed for cough. 90 capsule 1   budesonide-formoterol (SYMBICORT) 160-4.5 MCG/ACT inhaler Inhale 2 puffs into the lungs every 12 (twelve) hours. 1 each 3   norethindrone-ethinyl estradiol-FE (LOESTRIN FE 1/20) 1-20 MG-MCG tablet Take 1 tablet by mouth daily. 28 tablet 11   sodium chloride HYPERTONIC 3 % nebulizer solution Take by nebulization as needed for other. 750 mL 6   zolpidem (  AMBIEN) 5 MG tablet Take 1 tablet (5 mg total) by mouth at bedtime as needed for sleep. 30 tablet 0   No facility-administered medications prior to visit.     Review of Systems:   Constitutional:   No  weight loss, night sweats,  Fevers, chills, fatigue, or  lassitude.  HEENT:   No headaches,  Difficulty swallowing,  Tooth/dental problems, or  Sore throat,                No sneezing, itching,  ear ache,  +nasal congestion, post nasal drip,   CV:  No chest pain,  Orthopnea, PND, swelling in lower extremities, anasarca, dizziness, palpitations, syncope.   GI  No heartburn, indigestion, abdominal pain, nausea, vomiting, diarrhea, change in bowel habits, loss of appetite, bloody stools.   Resp:   No excess mucus, no productive cough,  No non-productive cough,  No coughing up of blood.  No change in color of mucus.  No wheezing.  No chest wall deformity  Skin: no rash or lesions.  GU: no dysuria, change in color of urine, no urgency or frequency.  No flank pain, no hematuria   MS:  No joint pain or swelling.  No decreased range of motion.  No back pain.    Physical Exam  BP 112/66   Pulse 63   Ht 5\' 3"  (1.6 m)   Wt 119 lb (54 kg)   SpO2 99%   BMI 21.08 kg/m   GEN: A/Ox3; pleasant , NAD, well nourished , thin    HEENT:  [place]/AT, NOSE-clear, THROAT-clear, no lesions, no postnasal drip or exudate noted.   NECK:  Supple w/ fair ROM; no JVD; normal carotid impulses w/o bruits; no thyromegaly or nodules palpated; no lymphadenopathy.    RESP  Clear  P & A; w/o, wheezes/ rales/ or rhonchi. no accessory muscle use, no dullness to percussion  CARD:  RRR, no m/r/g, no peripheral edema, pulses intact, no cyanosis or clubbing.  GI:   Soft & nt; nml bowel sounds; no organomegaly or masses detected.   Musco: Warm bil, no deformities or joint swelling noted.   Neuro: alert, no focal deficits noted.    Skin: Warm, no lesions or rashes    Lab Results:  CBC    Component Value Date/Time   WBC 9.7 11/04/2022 1006   RBC 5.31 (H) 11/04/2022 1006   HGB 13.9 11/04/2022 1006   HCT 41.4 11/04/2022 1006   PLT 429.0 (H) 11/04/2022 1006   MCV 78.1 11/04/2022 1006   MCH 26.7 11/10/2020 2144   MCHC 33.5 11/04/2022 1006   RDW 14.2 11/04/2022 1006   LYMPHSABS 3.6 11/04/2022 1006   MONOABS 0.6 11/04/2022 1006   EOSABS 1.5 (H) 11/04/2022 1006   BASOSABS 0.1 11/04/2022 1006     BMET    Component Value Date/Time   NA 137 11/04/2022 1006   K 4.4 11/04/2022 1006   CL 100 11/04/2022 1006   CO2 32 11/04/2022 1006   GLUCOSE 74 11/04/2022 1006   BUN 13 11/04/2022 1006   CREATININE 0.59 11/04/2022 1006   CALCIUM 9.9 11/04/2022 1006   GFRNONAA >60 11/10/2020 2144    BNP No results found for: "BNP"  ProBNP No results found for: "PROBNP"  Imaging: No results found.  Administration History     None           No data to display          No results found for: "NITRICOXIDE"  Assessment & Plan:   DOE (dyspnea on exertion) Shortness of breath, chest tightness and fatigue questionable etiology.  May be an asthma flare.  Will check IgE and CBC with differential.  She did have recent travel will check a D-dimer if positive will need to go forward with a CTA to rule  out PE.  Low suspicion.  will maximize her asthma regimen.  Use triple therapy inhaler-   With Breztri inhaler.  Add in Singulair daily.  Zyrtec briefly.  Increase flutter valve to twice daily.    Continue on saline nebs.  Check PFTs on return  Plan  Patient Instructions  Chest xray today.  Labs today.  Begin Breztri sample 2 puffs Twice daily  , (hold Symbicort while taking-resume once sample is done )  Increase Flutter valve Twice daily   Continue on Saline neb daily  Albuterol inhaler As needed   Begin Singulair 10mg   At bedtime   Zyrtec 10mg  At bedtime  for 1 week and then As needed   Follow up with Dr. Wynona Neat in 6-8 weeks with PFT and As needed   Please contact office for sooner follow up if symptoms do not improve or worsen or seek emergency care  p     Bronchiectasis with (acute) exacerbation (HCC)  does not appear to be an acute exacerbation as she has no increased cough or congestion.  Will check chest x-ray today.  Patient is encouraged on her mucociliary clearance.  Increase flutter valve to twice daily.  Continue with saline nebs.   Unable to give a sputum  culture.  Plan  Patient Instructions  Chest xray today.  Labs today.  Begin Breztri sample 2 puffs Twice daily  , (hold Symbicort while taking-resume once sample is done )  Increase Flutter valve Twice daily   Continue on Saline neb daily  Albuterol inhaler As needed   Begin Singulair 10mg   At bedtime   Zyrtec 10mg  At bedtime  for 1 week and then As needed   Follow up with Dr. Wynona Neat in 6-8 weeks with PFT and As needed   Please contact office for sooner follow up if symptoms do not improve or worsen or seek emergency care  p     History of asthma   Suspected asthma flare.  Will change to Breztri x 1 sample then resume Symbicort.  Albuterol inhaler as needed.  Hold on steroids at this time.  Patient says that she has not done well on prednisone in the past.   Check CBC with differential and IgE Add Singulair daily  Plan  Patient Instructions  Chest xray today.  Labs today.  Begin Breztri sample 2 puffs Twice daily  , (hold Symbicort while taking-resume once sample is done )  Increase Flutter valve Twice daily   Continue on Saline neb daily  Albuterol inhaler As needed   Begin Singulair 10mg   At bedtime   Zyrtec 10mg  At bedtime  for 1 week and then As needed   Follow up with Dr. Wynona Neat in 6-8 weeks with PFT and As needed   Please contact office for sooner follow up if symptoms do not improve or worsen or seek emergency care  p      I spent   41 minutes dedicated to the care of this patient on the date of this encounter to include pre-visit review of records, face-to-face time with the patient discussing conditions above, post visit ordering of testing, clinical documentation with the electronic health record, making appropriate  referrals as documented, and communicating necessary findings to members of the patients care team.     Rubye Oaks, NP 06/20/2023

## 2023-06-20 NOTE — Addendum Note (Signed)
Addended by: Glynda Jaeger on: 06/20/2023 02:08 PM   Modules accepted: Orders

## 2023-06-20 NOTE — Patient Instructions (Addendum)
Chest xray today.  Labs today.  Begin Breztri sample 2 puffs Twice daily  , (hold Symbicort while taking-resume once sample is done )  Increase Flutter valve Twice daily   Continue on Saline neb daily  Albuterol inhaler As needed   Begin Singulair 10mg   At bedtime   Zyrtec 10mg  At bedtime  for 1 week and then As needed   Follow up with Dr. Wynona Neat in 6-8 weeks with PFT and As needed   Please contact office for sooner follow up if symptoms do not improve or worsen or seek emergency care  p

## 2023-06-20 NOTE — Assessment & Plan Note (Signed)
Suspected asthma flare.  Will change to Breztri x 1 sample then resume Symbicort.  Albuterol inhaler as needed.  Hold on steroids at this time.  Patient says that she has not done well on prednisone in the past.   Check CBC with differential and IgE Add Singulair daily  Plan  Patient Instructions  Chest xray today.  Labs today.  Begin Breztri sample 2 puffs Twice daily  , (hold Symbicort while taking-resume once sample is done )  Increase Flutter valve Twice daily   Continue on Saline neb daily  Albuterol inhaler As needed   Begin Singulair 10mg   At bedtime   Zyrtec 10mg  At bedtime  for 1 week and then As needed   Follow up with Dr. Wynona Neat in 6-8 weeks with PFT and As needed   Please contact office for sooner follow up if symptoms do not improve or worsen or seek emergency care  p

## 2023-06-20 NOTE — Assessment & Plan Note (Signed)
does not appear to be an acute exacerbation as she has no increased cough or congestion.  Will check chest x-ray today.  Patient is encouraged on her mucociliary clearance.  Increase flutter valve to twice daily.  Continue with saline nebs.   Unable to give a sputum culture.  Plan  Patient Instructions  Chest xray today.  Labs today.  Begin Breztri sample 2 puffs Twice daily  , (hold Symbicort while taking-resume once sample is done )  Increase Flutter valve Twice daily   Continue on Saline neb daily  Albuterol inhaler As needed   Begin Singulair 10mg   At bedtime   Zyrtec 10mg  At bedtime  for 1 week and then As needed   Follow up with Dr. Wynona Neat in 6-8 weeks with PFT and As needed   Please contact office for sooner follow up if symptoms do not improve or worsen or seek emergency care  p

## 2023-07-13 ENCOUNTER — Other Ambulatory Visit: Payer: Self-pay | Admitting: Pulmonary Disease

## 2023-08-25 ENCOUNTER — Encounter: Payer: Self-pay | Admitting: Pulmonary Disease

## 2023-09-14 NOTE — Telephone Encounter (Signed)
Prior Auth team will you check with his insurance and see what a cheaper alternative to the Symbicort is? Thank you!

## 2023-09-16 ENCOUNTER — Other Ambulatory Visit (HOSPITAL_COMMUNITY): Payer: Self-pay

## 2023-09-16 NOTE — Telephone Encounter (Signed)
Pharmacy Patient Advocate Encounter  Insurance verification completed.    The patient is insured through [facility]   Ran test claim for [employer]. Currently a quantity of 10.7g is a 30 day supply and the co-pay is $35.00 .   This test claim was processed through [facility]- copay amounts may vary at other pharmacies due to pharmacy/plan contracts, or as the patient moves through the different stages of their insurance plan.

## 2023-09-19 NOTE — Telephone Encounter (Signed)
Dr Wynona Neat, please advise if you are ok with her switching to [facility].

## 2023-09-20 ENCOUNTER — Other Ambulatory Visit: Payer: Self-pay | Admitting: Pulmonary Disease

## 2023-09-20 MED ORDER — BREZTRI AEROSPHERE 160-9-4.8 MCG/ACT IN AERO: 2.0000 | INHALATION_SPRAY | Freq: Two times a day (BID) | RESPIRATORY_TRACT | 6 refills | Status: AC

## 2023-09-20 NOTE — Progress Notes (Signed)
Prescription for [facility] sent to pharmacy

## 2024-03-06 ENCOUNTER — Telehealth: Payer: Self-pay

## 2024-03-06 NOTE — Telephone Encounter (Signed)
 Copied from CRM [id number]. Topic: Clinical - Medical Advice >> Mar 05, 2024 10:43 AM Erica Khan wrote: Reason for CRM: Patient has moved to [place]  and needs information on what medication patient is allergic to? CT Scan in 2021, patient doctor needs the imaging scan not just the report. Patient states medical records cannot help her on this matter. Please advise and call back at [phone].  Spoke with patient regarding prior message . I have advised patient she will need to contact [facility] regarding her scan she she can have the out the imaging on a disk for her and also wanted to verify what medication she is allergic to .   Patient's voice was understanding.Nothing else further needed at this time .
